# Patient Record
Sex: Male | Born: 1935 | Race: White | Hispanic: No | Marital: Married | Smoking: Former smoker
Health system: Southern US, Community
[De-identification: ages and names within clinical notes are randomized; demographics above are authoritative.]

## PROBLEM LIST (undated history)

## (undated) DIAGNOSIS — E039 Hypothyroidism, unspecified: Secondary | ICD-10-CM

## (undated) DIAGNOSIS — I252 Old myocardial infarction: Secondary | ICD-10-CM

## (undated) DIAGNOSIS — K219 Gastro-esophageal reflux disease without esophagitis: Secondary | ICD-10-CM

## (undated) DIAGNOSIS — K611 Rectal abscess: Secondary | ICD-10-CM

## (undated) DIAGNOSIS — I10 Essential (primary) hypertension: Secondary | ICD-10-CM

## (undated) DIAGNOSIS — F419 Anxiety disorder, unspecified: Secondary | ICD-10-CM

## (undated) DIAGNOSIS — E7800 Pure hypercholesterolemia, unspecified: Secondary | ICD-10-CM

## (undated) HISTORY — PX: CORONARY ARTERY BYPASS GRAFT: SHX141

## (undated) HISTORY — PX: OTHER SURGICAL HISTORY: SHX169

## (undated) HISTORY — DX: Old myocardial infarction: I25.2

## (undated) HISTORY — PX: COLONOSCOPY: SHX174

## (undated) HISTORY — PX: CORONARY ANGIOPLASTY WITH STENT PLACEMENT: SHX49

## (undated) HISTORY — DX: Pure hypercholesterolemia, unspecified: E78.00

---

## 2010-09-03 ENCOUNTER — Ambulatory Visit (HOSPITAL_COMMUNITY): Admission: RE | Admit: 2010-09-03 | Payer: Self-pay | Source: Home / Self Care | Admitting: Internal Medicine

## 2010-09-03 ENCOUNTER — Ambulatory Visit: Payer: Self-pay | Admitting: Internal Medicine

## 2010-10-08 ENCOUNTER — Ambulatory Visit (HOSPITAL_COMMUNITY)
Admission: RE | Admit: 2010-10-08 | Discharge: 2010-10-08 | Payer: Self-pay | Source: Home / Self Care | Attending: Internal Medicine | Admitting: Internal Medicine

## 2010-10-18 NOTE — Op Note (Signed)
NAME:  Anthony Harrell, Anthony Harrell                ACCOUNT NO.:  [id number]  MEDICAL RECORD NO.:  [id number]          PATIENT TYPE:  AMB  LOCATION:  DAY                           FACILITY:  [facility]  PHYSICIAN:  Lionel December, M.D.    DATE OF BIRTH:  03/01/1929  DATE OF PROCEDURE:  10/08/2010 DATE OF DISCHARGE:  10/08/2010                              OPERATIVE REPORT   PROCEDURE:  Colonoscopy with snare polypectomy.  INDICATIONS:  Anthony Harrell is a 77 year old Caucasian male who is undergoing average risk screening colonoscopy.  His last exam was 10 years ago. Procedure risks were reviewed with the patient and informed consent was obtained.  MEDICATIONS FOR CONSCIOUS SEDATION: 1. Demerol 25 mg IV. 2. Versed 3 mg IV.  FINDINGS:  Procedure performed in endoscopy suite.  The patient's vital signs and O2 sats were monitored during the procedure and remained stable.  The patient was placed in left lateral recumbent position and rectal examination performed.  She has perianal deformity secondary to previous surgeries and noncritical anal stenosis.  Pediatric colonoscope was carefully passed across the canal into rectum and thereafter under direct vision into sigmoid colon beyond.  Preparation was satisfactory. Multiple diverticula at sigmoid colon.  Scope was passed into cecum which was identified by appendiceal orifice and ileocecal valve. Pictures were taken for the record.  As the scope was withdrawn, colonic mucosa was carefully examined.  Small polyp was ablated via cold biopsy from the cecum and another one from the transverse colon and there were 2 at the proximal sigmoid colon which were ablated via cold biopsy and submitted in one container.  In the distal sigmoid colon, there were 2 polyps which were located right next to each other, the larger was about 8-9 mm and other one was smaller.  Both of these were snared and submitted in one container.  There was also diffuse pigmentation primarily of the  distal half of the colon consistent with melanosis coli.  The rectal mucosa was normal.  Scope was retroflexed to examine anorectal junction and small hemorrhoids were noted below the dentate line.  Endoscope was straightened and withdrawn.  Withdrawal time was about 20 minutes.  FINAL DIAGNOSES: 1. Examination performed to cecum. 2. Multiple diverticula at sigmoid colon. 3. Mild changes of melanosis coli. 4. Four small polyps were ablated via cold biopsy, one from the cecum,     one from transverse colon and two from the sigmoid colon submitted     in one container. 5. Two polyps were snared from distal sigmoid colon.  The largest one     was 8-9 mm and these were submitted in one container. 6. Anal stenosis secondary to previous surgeries.  Small external     hemorrhoids.  RECOMMENDATIONS:  High-fiber diet.  The patient resume his ASA tomorrow, but we will start his Plavix in 5 days.  I will be contacting the patient with results of biopsy and further recommendations.     Lionel December, M.D.     NR/MEDQ  D:  10/08/2010  T:  10/09/2010  Job:  [id number]  cc:   Dr. Clent Ridges, [place]  Electronically Signed by Lionel December M.D. on 10/18/2010 01:40:53 PM

## 2013-12-07 ENCOUNTER — Ambulatory Visit (INDEPENDENT_AMBULATORY_CARE_PROVIDER_SITE_OTHER): Payer: Medicare Other | Admitting: Internal Medicine

## 2013-12-07 ENCOUNTER — Encounter (INDEPENDENT_AMBULATORY_CARE_PROVIDER_SITE_OTHER): Payer: Self-pay | Admitting: Internal Medicine

## 2013-12-07 ENCOUNTER — Other Ambulatory Visit (INDEPENDENT_AMBULATORY_CARE_PROVIDER_SITE_OTHER): Payer: Self-pay | Admitting: *Deleted

## 2013-12-07 ENCOUNTER — Encounter (INDEPENDENT_AMBULATORY_CARE_PROVIDER_SITE_OTHER): Payer: Self-pay | Admitting: *Deleted

## 2013-12-07 VITALS — BP 128/80 | HR 60 | Temp 98.0°F | Ht 69.0 in | Wt 193.9 lb

## 2013-12-07 DIAGNOSIS — K219 Gastro-esophageal reflux disease without esophagitis: Secondary | ICD-10-CM

## 2013-12-07 DIAGNOSIS — I25810 Atherosclerosis of coronary artery bypass graft(s) without angina pectoris: Secondary | ICD-10-CM | POA: Insufficient documentation

## 2013-12-07 DIAGNOSIS — E7800 Pure hypercholesterolemia, unspecified: Secondary | ICD-10-CM | POA: Insufficient documentation

## 2013-12-07 MED ORDER — ESOMEPRAZOLE MAGNESIUM 40 MG PO CPDR: 40.0000 mg | DELAYED_RELEASE_CAPSULE | Freq: Every day | ORAL | Status: DC

## 2013-12-07 NOTE — Progress Notes (Addendum)
Subjective:     Patient ID: Anthony Harrell, male   DOB: 1929-01-13, 80 y.o.   MRN: [id number]  HPI Presents today with c/o epigastric pain. She has had symptoms x 2 weeks.  He tells me his wife placed him on Nexium and it has helped. Notices the pain in the morning. Not related to food. Pain starts in the epigastric and may radiate under both ribs cases. Appetite is good. He eats anything he wants. No pain in 2 days. No weight loss.  He has had constipation for ages. He takes a stool softener. He has a BM usually daily. He likes his stools loose because of the anal stenosis.   10/08/2010 Colonoscopy with polypectomy, Dr. Laural Golden: FINAL DIAGNOSES:  1. Examination performed to cecum.  2. Multiple diverticula at sigmoid colon.  3. Mild changes of melanosis coli.  4. Four small polyps were ablated via cold biopsy, one from the cecum,  one from transverse colon and two from the sigmoid colon submitted  in one container.  5. Two polyps were snared from distal sigmoid colon. The largest one  was 8-9 mm and these were submitted in one container.  6. Anal stenosis secondary to previous surgeries. Small external  hemorrhoids. Biopsy: Cecal: hyperplastic, Transverse: hyperplastic, Sigmoid: tubular adenoma     Review of Systems see hpi  Past Medical History  Diagnosis Date  . High cholesterol   . MI, old     Past Surgical History  Procedure Laterality Date  . 3 open heart surgeries    . Peri rectal abscess    . Stomach aneurysm surgery  (6cm)    . Coronary angioplasty with stent placement      10 yrs ago.    No Known Allergies  No current outpatient prescriptions on file prior to visit.   No current facility-administered medications on file prior to visit.        Objective:   Physical Exam  Filed Vitals:   12/07/13 1008  BP: 128/80  Pulse: 60  Temp: 98 F (36.7 C)  Height: 5\' 9"  (1.753 m)  Weight: 193 lb 14.4 oz (87.952 kg)   Alert and oriented. Skin warm and dry.  Oral mucosa is moist.   . Sclera anicteric, conjunctivae is pink. Thyroid not enlarged. No cervical lymphadenopathy. Lungs clear. Heart regular rate and rhythm.  Abdomen is soft. Bowel sounds are positive. No hepatomegaly. No abdominal masses felt. No tenderness.  No edema to lower extremities.       Assessment:    GERD, much better since starting wife's Nexium. PUD needs to be ruled out.    Plan:     EGD with Dr. Laural Golden. One month supply of Dexilant given to patient.

## 2013-12-07 NOTE — Patient Instructions (Addendum)
EGD with Dr. Laural Golden.The risks and benefits such as perforation, bleeding, and infection were reviewed with the patient and is agreeable. Separate the Nexium and Plavix.

## 2013-12-11 ENCOUNTER — Encounter (HOSPITAL_COMMUNITY): Payer: Self-pay | Admitting: Pharmacy Technician

## 2013-12-27 ENCOUNTER — Ambulatory Visit (HOSPITAL_COMMUNITY)
Admission: RE | Admit: 2013-12-27 | Discharge: 2013-12-27 | Disposition: A | Discharge status: Home / Self Care | Payer: Medicare Other | Source: Ambulatory Visit | Attending: Internal Medicine | Admitting: Internal Medicine

## 2013-12-27 ENCOUNTER — Encounter (HOSPITAL_COMMUNITY)
Admission: RE | Disposition: A | Discharge status: Home / Self Care | Payer: Self-pay | Source: Ambulatory Visit | Attending: Internal Medicine

## 2013-12-27 ENCOUNTER — Encounter (HOSPITAL_COMMUNITY): Payer: Self-pay | Admitting: *Deleted

## 2013-12-27 DIAGNOSIS — Z79899 Other long term (current) drug therapy: Secondary | ICD-10-CM | POA: Insufficient documentation

## 2013-12-27 DIAGNOSIS — E7800 Pure hypercholesterolemia, unspecified: Secondary | ICD-10-CM | POA: Insufficient documentation

## 2013-12-27 DIAGNOSIS — K208 Other esophagitis without bleeding: Secondary | ICD-10-CM

## 2013-12-27 DIAGNOSIS — K219 Gastro-esophageal reflux disease without esophagitis: Secondary | ICD-10-CM

## 2013-12-27 DIAGNOSIS — K2960 Other gastritis without bleeding: Secondary | ICD-10-CM | POA: Insufficient documentation

## 2013-12-27 DIAGNOSIS — Z7982 Long term (current) use of aspirin: Secondary | ICD-10-CM | POA: Insufficient documentation

## 2013-12-27 DIAGNOSIS — F411 Generalized anxiety disorder: Secondary | ICD-10-CM | POA: Insufficient documentation

## 2013-12-27 DIAGNOSIS — R1013 Epigastric pain: Secondary | ICD-10-CM | POA: Insufficient documentation

## 2013-12-27 DIAGNOSIS — K2100 Gastro-esophageal reflux disease with esophagitis, without bleeding: Secondary | ICD-10-CM | POA: Insufficient documentation

## 2013-12-27 DIAGNOSIS — I10 Essential (primary) hypertension: Secondary | ICD-10-CM | POA: Insufficient documentation

## 2013-12-27 DIAGNOSIS — Z87891 Personal history of nicotine dependence: Secondary | ICD-10-CM | POA: Insufficient documentation

## 2013-12-27 HISTORY — DX: Anxiety disorder, unspecified: F41.9

## 2013-12-27 HISTORY — PX: ESOPHAGOGASTRODUODENOSCOPY: SHX5428

## 2013-12-27 HISTORY — DX: Essential (primary) hypertension: I10

## 2013-12-27 HISTORY — DX: Hypothyroidism, unspecified: E03.9

## 2013-12-27 HISTORY — DX: Gastro-esophageal reflux disease without esophagitis: K21.9

## 2013-12-27 SURGERY — EGD (ESOPHAGOGASTRODUODENOSCOPY)
Anesthesia: Moderate Sedation

## 2013-12-27 MED ORDER — MIDAZOLAM HCL 5 MG/5ML IJ SOLN
INTRAMUSCULAR | Status: AC
  Filled 2013-12-27: qty 10

## 2013-12-27 MED ORDER — SODIUM CHLORIDE 0.9 % IV SOLN
INTRAVENOUS | Status: DC
  Administered 2013-12-27: 12:00:00 via INTRAVENOUS

## 2013-12-27 MED ORDER — DEXLANSOPRAZOLE 60 MG PO CPDR: 60.0000 mg | DELAYED_RELEASE_CAPSULE | Freq: Every day | ORAL | Status: DC

## 2013-12-27 MED ORDER — MIDAZOLAM HCL 5 MG/5ML IJ SOLN
INTRAMUSCULAR | Status: DC | PRN
  Administered 2013-12-27: 1 mg via INTRAVENOUS
  Administered 2013-12-27: 2 mg via INTRAVENOUS
  Administered 2013-12-27: 1 mg via INTRAVENOUS

## 2013-12-27 MED ORDER — BUTAMBEN-TETRACAINE-BENZOCAINE 2-2-14 % EX AERO
INHALATION_SPRAY | CUTANEOUS | Status: DC | PRN
  Administered 2013-12-27: 1 via TOPICAL

## 2013-12-27 MED ORDER — MEPERIDINE HCL 50 MG/ML IJ SOLN
INTRAMUSCULAR | Status: DC | PRN
  Administered 2013-12-27: 25 mg via INTRAVENOUS

## 2013-12-27 MED ORDER — MEPERIDINE HCL 50 MG/ML IJ SOLN
INTRAMUSCULAR | Status: AC
  Filled 2013-12-27: qty 1

## 2013-12-27 MED ORDER — STERILE WATER FOR IRRIGATION IR SOLN
Status: DC | PRN
  Administered 2013-12-27: 13:00:00

## 2013-12-27 NOTE — Discharge Instructions (Addendum)
Resume usual medications and diet. Remember to take Dexilant 60 mg by mouth 30 minutes before breakfast daily and take Plavix every evening. No driving for 24 hours. Physician will call with result of blood test.  Esophagogastroduodenoscopy Care After Refer to this sheet in the next few weeks. These instructions provide you with information on caring for yourself after your procedure. Your caregiver may also give you more specific instructions. Your treatment has been planned according to current medical practices, but problems sometimes occur. Call your caregiver if you have any problems or questions after your procedure.  HOME CARE INSTRUCTIONS  Do not eat or drink anything until the numbing medicine (local anesthetic) has worn off and your gag reflex has returned. You will know that the local anesthetic has worn off when you can swallow comfortably.  Do not drive for 12 hours after the procedure or as directed by your caregiver.  Only take medicines as directed by your caregiver. SEEK MEDICAL CARE IF:   You cannot stop coughing.  You are not urinating at all or less than usual. SEEK IMMEDIATE MEDICAL CARE IF:  You have difficulty swallowing.  You cannot eat or drink.  You have worsening throat or chest pain.  You have dizziness, lightheadedness, or you faint.  You have nausea or vomiting.  You have chills.  You have a fever.  You have severe abdominal pain.  You have black, tarry, or bloody stools. Document Released: 08/17/2012 Document Reviewed: 08/17/2012 [facility] Patient Information 2014 [place], [place].

## 2013-12-27 NOTE — Op Note (Addendum)
EGD PROCEDURE REPORT  PATIENT:  Anthony Harrell  MR#:  [id number] Birthdate:  12/16/1928, 80 y.o., male Endoscopist:  Dr. Rogene Houston, MD Procedure Date: 12/27/2013  Procedure:   EGD  Indications:  Patient is an 80 year old Caucasian male who presents with several week history of epigastric pain not associated with nausea vomiting or melena he also denied anorexia or weight loss. Patient is on Plavix and low-dose aspirin. He is feeling better with PPI. He is undergoing diagnostic EGD.            Informed Consent:  The risks, benefits, alternatives & imponderables which include, but are not limited to, bleeding, infection, perforation, drug reaction and potential missed lesion have been reviewed.  The potential for biopsy, lesion removal, esophageal dilation, etc. have also been discussed.  Questions have been answered.  All parties agreeable.  Please see history & physical in medical record for more information.  Medications:  Demerol 25 mg IV Versed 4 mg IV Cetacaine spray topically for oropharyngeal anesthesia  Description of procedure:  The endoscope was introduced through the mouth and advanced to the second portion of the duodenum without difficulty or limitations. The mucosal surfaces were surveyed very carefully during advancement of the scope and upon withdrawal.  Findings:  Esophagus:  Mucosa of the esophagus was normal. Focal edema and erythema noted at GE junction without erosions ring or stricture. GEJ:  41 cm Stomach:  Stomach was empty and distended very well with insufflation. Folds in the proximal stomach were normal. Scattered antral erosions noted along with scar at angularis best seen on retroflexed view. Pyloric channel was patent. Fundus and cardia were unremarkable. Duodenum:  Normal bulbar and post bulbar mucosa.  Therapeutic/Diagnostic Maneuvers Performed:  None  Complications:  None  Impression: Mild changes of reflux esophagitis limited to GE  junction. Erosive antral gastritis and small scar at angularis  Comment; Patients symptoms could be secondary to peptic ulcer disease which has healed with therapy. Need to rule out H. pylori infection.  Recommendations:  Resume usual medications including clopidogrel or Plavix. H. pylori serology. New prescription for Dexilant sent to patient's pharmacy.  Rogene Houston  12/27/2013  1:07 PM  CC: Dr. Merryl Hacker Not In System & Dr. No ref. provider found

## 2013-12-27 NOTE — H&P (Signed)
Anthony Harrell is an 80 y.o. male.   Chief Complaint: Patient is here for EGD. HPI: Patient is an  80 year old Caucasian male who presents with several week history of epigastric pain not associated with nausea vomiting heartburn melena or rectal bleeding. Pain lately has been radiating to under the right rib cage. Since he he has been taking Dexilant he feels much better. He does not take any NSAIDs but he has been on low-dose aspirin and Plavix.  Past Medical History  Diagnosis Date  . High cholesterol   . MI, old   . Hypothyroidism   . GERD (gastroesophageal reflux disease)   . Hypertension   . Anxiety     Past Surgical History  Procedure Laterality Date  . 3 open heart surgeries    . Peri rectal abscess    . Stomach aneurysm surgery  (6cm)    . Coronary angioplasty with stent placement      10 yrs ago.    History reviewed. No pertinent family history. Social History:  reports that he has quit smoking. His smoking use included Cigarettes. He has a 50 pack-year smoking history. He does not have any smokeless tobacco history on file. He reports that he does not drink alcohol or use illicit drugs.  Allergies: No Known Allergies  Medications Prior to Admission  Medication Sig Dispense Refill  . aspirin 81 MG tablet Take 81 mg by mouth daily.      Marland Kitchen atorvastatin (LIPITOR) 80 MG tablet Take 80 mg by mouth daily.      . Cholecalciferol (VITAMIN D-3) 1000 UNITS CAPS Take 1 capsule by mouth daily.      . clopidogrel (PLAVIX) 75 MG tablet Take 75 mg by mouth daily with breakfast.      . dexlansoprazole (DEXILANT) 60 MG capsule Take 60 mg by mouth daily.      Marland Kitchen ezetimibe (ZETIA) 10 MG tablet Take 10 mg by mouth daily.      Marland Kitchen levothyroxine (SYNTHROID, LEVOTHROID) 150 MCG tablet Take 150 mcg by mouth daily before breakfast.      . LORazepam (ATIVAN) 1 MG tablet Take 1 mg by mouth daily.      . metoprolol (LOPRESSOR) 50 MG tablet Take 50 mg by mouth daily.      . tamsulosin (FLOMAX) 0.4  MG CAPS capsule Take 0.4 mg by mouth daily.        No results found for this or any previous visit (from the past 48 hour(s)). No results found.  ROS  Blood pressure 178/81, pulse 70, temperature 98.1 F (36.7 C), temperature source Oral, height 5\' 9"  (1.753 m), weight 193 lb (87.544 kg), SpO2 94.00%. Physical Exam  Constitutional: He appears well-developed and well-nourished.  HENT:  Mouth/Throat: Oropharynx is clear and moist.  Eyes: Conjunctivae are normal. No scleral icterus.  Neck: No thyromegaly present.  Cardiovascular: Normal rate, regular rhythm and normal heart sounds.   No murmur heard. Respiratory: Effort normal and breath sounds normal.  GI: Soft. He exhibits no distension and no mass. There is no tenderness.  Upper midline scar  Musculoskeletal: He exhibits no edema.  Lymphadenopathy:    He has no cervical adenopathy.  Neurological: He is alert.  Skin: Skin is warm.     Assessment/Plan Epigastric pain. ?PUD. Diagnostic EGD.  Rogene Houston 12/27/2013, 12:44 PM

## 2013-12-28 LAB — H. PYLORI ANTIBODY, IGG: H Pylori IgG: 0.44 {ISR}

## 2014-01-01 ENCOUNTER — Encounter (HOSPITAL_COMMUNITY): Payer: Self-pay | Admitting: Internal Medicine

## 2014-01-01 NOTE — Progress Notes (Signed)
Apt has an apt on 01/29/14 and another 3 month f/u was scheduled for 04/24/14. Wife apt was rescheduled to 04/24/14.

## 2014-01-22 ENCOUNTER — Telehealth (INDEPENDENT_AMBULATORY_CARE_PROVIDER_SITE_OTHER): Payer: Self-pay | Admitting: *Deleted

## 2014-01-22 NOTE — Telephone Encounter (Signed)
A PA was completed over the phone with Anthony Harrell. The patient is approved indefinitely. Confirmation number is [id number]. Patient called and made aware.

## 2014-01-30 ENCOUNTER — Encounter (INDEPENDENT_AMBULATORY_CARE_PROVIDER_SITE_OTHER): Payer: Self-pay | Admitting: Internal Medicine

## 2014-01-30 ENCOUNTER — Ambulatory Visit (INDEPENDENT_AMBULATORY_CARE_PROVIDER_SITE_OTHER): Payer: Medicare Other | Admitting: Internal Medicine

## 2014-01-30 VITALS — BP 118/68 | HR 66 | Temp 97.3°F | Resp 18 | Ht 69.0 in | Wt 195.2 lb

## 2014-01-30 DIAGNOSIS — R1013 Epigastric pain: Secondary | ICD-10-CM

## 2014-01-30 DIAGNOSIS — K219 Gastro-esophageal reflux disease without esophagitis: Secondary | ICD-10-CM

## 2014-01-30 NOTE — Patient Instructions (Signed)
Notify if abdominal pain becomes more frequent.

## 2014-01-30 NOTE — Progress Notes (Signed)
Presenting complaint;  Followup for epigastric pain.  Subjective:  Patient is an 80 year old Caucasian male who was evaluated about 8 weeks ago for epigastric pain and history of melena. He had been taking his wife's Nexium with control of heartburn but not epigastric pain. He had been on low-dose aspirin and Plavix. He was begun on Dexilant. He underwent EGD on 12/27/2013 revealing mild changes of reflux esophagitis at GE junction erosive antral gastritis and sclerotic angularis. H. pylori serology was negative. He has been maintained on Dexilant. He states he is feeling much better. He is not taking PPI on a daily basis. He has had very little epigastric pain and now has migrated to the right of midline. This pain is not associated with nausea vomiting and he has not experienced any more episodes of melena. He states he got up one morning and felt very weak and could not support and fell and hit his bed. He was seen in urgent care and also at [facility] clinic the same day. He has followup appointment. He has not had any more spells. He says his appetite is very good. His weight is stable. He gives history of long-standing constipation and presently he is taking [employer] brand laxative which works well for him.        Current Medications: Outpatient Encounter Prescriptions as of 01/30/2014  Medication Sig  . aspirin 81 MG tablet Take 81 mg by mouth daily.  Marland Kitchen atorvastatin (LIPITOR) 80 MG tablet Take 40 mg by mouth daily.   . Cholecalciferol (VITAMIN D-3) 1000 UNITS CAPS Take 1 capsule by mouth daily.  . clopidogrel (PLAVIX) 75 MG tablet Take 75 mg by mouth daily with breakfast.  . dexlansoprazole (DEXILANT) 60 MG capsule Take 60 mg by mouth daily.  Marland Kitchen ezetimibe (ZETIA) 10 MG tablet Take 10 mg by mouth daily.  Marland Kitchen levothyroxine (SYNTHROID, LEVOTHROID) 150 MCG tablet Take 150 mcg by mouth daily before breakfast.  . LORazepam (ATIVAN) 1 MG tablet Take 1 mg by mouth daily.  . metoprolol (LOPRESSOR) 50 MG  tablet Take 50 mg by mouth daily.  . tamsulosin (FLOMAX) 0.4 MG CAPS capsule Take 0.4 mg by mouth as needed.      Objective: Blood pressure 118/68, pulse 66, temperature 97.3 F (36.3 C), temperature source Oral, resp. rate 18, height 5\' 9"  (1.753 m), weight 195 lb 3.2 oz (88.542 kg). Patient is alert and in no acute distress. Conjunctiva is pink. Sclera is nonicteric Oropharyngeal mucosa is normal. No neck masses or thyromegaly noted. Cardiac exam with regular rhythm normal S1 and S2. No murmur or gallop noted. Lungs are clear to auscultation. Abdomen symmetrical and soft without tenderness organomegaly or masses.  No LE edema or clubbing noted.     Assessment:  #1. Epigastric pain has improved significantly since he has been on PPI. It is not infrequent although it has migrated to the right. He does not have any other symptoms to suggest gallbladder disease but we will have to keep an eye on him. #2. GERD. Heartburn well-controlled with therapy.   Plan:  Patient advised to take Dexilant every morning. If pain recurs or if he has nausea and vomiting will consider upper abdominal ultrasound. Office visit in 6 months.

## 2014-04-24 ENCOUNTER — Ambulatory Visit (INDEPENDENT_AMBULATORY_CARE_PROVIDER_SITE_OTHER): Payer: Medicare Other | Admitting: Internal Medicine

## 2014-06-14 ENCOUNTER — Encounter (INDEPENDENT_AMBULATORY_CARE_PROVIDER_SITE_OTHER): Payer: Self-pay | Admitting: *Deleted

## 2014-08-06 ENCOUNTER — Ambulatory Visit (INDEPENDENT_AMBULATORY_CARE_PROVIDER_SITE_OTHER): Payer: Medicare Other | Admitting: Internal Medicine

## 2014-08-21 ENCOUNTER — Encounter (INDEPENDENT_AMBULATORY_CARE_PROVIDER_SITE_OTHER): Payer: Self-pay | Admitting: Internal Medicine

## 2014-08-21 ENCOUNTER — Other Ambulatory Visit (INDEPENDENT_AMBULATORY_CARE_PROVIDER_SITE_OTHER): Payer: Self-pay | Admitting: Internal Medicine

## 2014-08-21 ENCOUNTER — Ambulatory Visit (HOSPITAL_COMMUNITY)
Admission: RE | Admit: 2014-08-21 | Discharge: 2014-08-21 | Disposition: A | Discharge status: Home / Self Care | Payer: Medicare Other | Source: Ambulatory Visit | Attending: Internal Medicine | Admitting: Internal Medicine

## 2014-08-21 ENCOUNTER — Telehealth (INDEPENDENT_AMBULATORY_CARE_PROVIDER_SITE_OTHER): Payer: Self-pay | Admitting: *Deleted

## 2014-08-21 ENCOUNTER — Ambulatory Visit (INDEPENDENT_AMBULATORY_CARE_PROVIDER_SITE_OTHER): Payer: Medicare Other | Admitting: Internal Medicine

## 2014-08-21 VITALS — BP 120/66 | HR 68 | Temp 97.6°F | Resp 16 | Ht 69.0 in | Wt 186.0 lb

## 2014-08-21 DIAGNOSIS — R1030 Lower abdominal pain, unspecified: Secondary | ICD-10-CM

## 2014-08-21 DIAGNOSIS — K5730 Diverticulosis of large intestine without perforation or abscess without bleeding: Secondary | ICD-10-CM | POA: Insufficient documentation

## 2014-08-21 DIAGNOSIS — K838 Other specified diseases of biliary tract: Secondary | ICD-10-CM | POA: Insufficient documentation

## 2014-08-21 DIAGNOSIS — I719 Aortic aneurysm of unspecified site, without rupture: Secondary | ICD-10-CM | POA: Diagnosis not present

## 2014-08-21 DIAGNOSIS — R630 Anorexia: Secondary | ICD-10-CM

## 2014-08-21 DIAGNOSIS — M4856XA Collapsed vertebra, not elsewhere classified, lumbar region, initial encounter for fracture: Secondary | ICD-10-CM | POA: Diagnosis not present

## 2014-08-21 DIAGNOSIS — R109 Unspecified abdominal pain: Secondary | ICD-10-CM | POA: Diagnosis present

## 2014-08-21 DIAGNOSIS — R110 Nausea: Secondary | ICD-10-CM

## 2014-08-21 LAB — POCT I-STAT CREATININE: CREATININE: 1.5 mg/dL — AB (ref 0.50–1.35)

## 2014-08-21 LAB — CBC
HEMATOCRIT: 41.9 % (ref 39.0–52.0)
Hemoglobin: 13.9 g/dL (ref 13.0–17.0)
MCH: 29.2 pg (ref 26.0–34.0)
MCHC: 33.2 g/dL (ref 30.0–36.0)
MCV: 88 fL (ref 78.0–100.0)
MPV: 9.9 fL (ref 9.4–12.4)
Platelets: 22 10*3/uL — CL (ref 150–400)
RBC: 4.76 MIL/uL (ref 4.22–5.81)
RDW: 13.9 % (ref 11.5–15.5)
WBC: 6.5 10*3/uL (ref 4.0–10.5)

## 2014-08-21 LAB — COMPREHENSIVE METABOLIC PANEL
ALBUMIN: 3.6 g/dL (ref 3.5–5.2)
ALT: 13 U/L (ref 0–53)
AST: 25 U/L (ref 0–37)
Alkaline Phosphatase: 121 U/L — ABNORMAL HIGH (ref 39–117)
BUN: 22 mg/dL (ref 6–23)
CALCIUM: 9.5 mg/dL (ref 8.4–10.5)
CHLORIDE: 100 meq/L (ref 96–112)
CO2: 24 meq/L (ref 19–32)
CREATININE: 1.35 mg/dL (ref 0.50–1.35)
GLUCOSE: 84 mg/dL (ref 70–99)
POTASSIUM: 5.4 meq/L — AB (ref 3.5–5.3)
Sodium: 134 mEq/L — ABNORMAL LOW (ref 135–145)
Total Bilirubin: 1.3 mg/dL — ABNORMAL HIGH (ref 0.3–1.2)
Total Protein: 6.8 g/dL (ref 6.0–8.3)

## 2014-08-21 MED ORDER — ONDANSETRON 4 MG PO TBDP: 4.0000 mg | ORAL_TABLET | Freq: Three times a day (TID) | ORAL | Status: DC | PRN

## 2014-08-21 MED ORDER — METRONIDAZOLE 500 MG PO TABS: 500.0000 mg | ORAL_TABLET | Freq: Two times a day (BID) | ORAL | Status: DC

## 2014-08-21 MED ORDER — IOHEXOL 300 MG/ML  SOLN
100.0000 mL | Freq: Once | INTRAMUSCULAR | Status: AC | PRN
  Administered 2014-08-21: 100 mL via INTRAVENOUS

## 2014-08-21 MED ORDER — CIPROFLOXACIN HCL 500 MG PO TABS: 500.0000 mg | ORAL_TABLET | Freq: Two times a day (BID) | ORAL | Status: DC

## 2014-08-21 NOTE — Telephone Encounter (Signed)
Per Dr.Rehman the patient will need to have labs drawn STAT

## 2014-08-21 NOTE — Patient Instructions (Signed)
Physician will call with results of blood work and CT when completed 

## 2014-08-21 NOTE — Progress Notes (Signed)
Presenting complaint;  Lower abdominal pain anorexia and nausea.  Subjective:  Anthony Harrell is 80 year old Caucasian male who was doing well until he fell off a ladder about 3 weeks ago. He fell from a height of 10-12 feet and hit 2 chairs on his right side and back. He was taken to emergency room. He also had scalp hematoma. He had MR of brain was negative. He also had plain abdominal and pelvic films and no abnormality noted. He was given hydrocodone which she's been taking an as-needed basis. He did not feel any better and within a couple of days he went to urgent care where EKG was noted to be abnormal and he was advised to see his cardiologist. He was seen by Dr. Virgilio Belling PA; repeat EKG did not show any new abnormalities. He was advised to see orthopedic surgeon told him to see a surgeon. Patient decided to call her office rather than see a Psychologist, sport and exercise. He is accompanied by his wife. States he feels miserable. He has no appetite. He's been nauseated but has not vomited. His weight is down by 9 pounds since his last visit of May 2015. He believes he's lost all of dysphagia and the last 3 weeks. He has mild pain across lower abdomen during the daytime but pain becomes quite intense after evening meals. In radiates into left testicle. He denies hematuria dysuria rectal bleeding or melena. His stools have been loose but usually 1 every other day. He has not experienced fever or chills. His wife does not remember seeing any bruise over his back lower abdomen and he fell.  Current Medications: Outpatient Encounter Prescriptions as of 08/21/2014  Medication Sig  . aspirin 81 MG tablet Take 81 mg by mouth daily.  Marland Kitchen atorvastatin (LIPITOR) 80 MG tablet Take 40 mg by mouth daily.   . Cholecalciferol (VITAMIN D-3) 1000 UNITS CAPS Take 1 capsule by mouth daily.  . clopidogrel (PLAVIX) 75 MG tablet Take 75 mg by mouth daily with breakfast.  . ezetimibe (ZETIA) 10 MG tablet Take 10 mg by mouth daily.  Marland Kitchen FLUZONE  QUADRIVALENT 0.5 ML injection Inject 0.5 mLs into the muscle once.   Marland Kitchen HYDROcodone-acetaminophen (NORCO/VICODIN) 5-325 MG per tablet Take by mouth as needed (Patient states that he takes 1/2 tablet as needed). for pain  . levothyroxine (SYNTHROID, LEVOTHROID) 150 MCG tablet Take 150 mcg by mouth daily before breakfast.  . lisinopril (PRINIVIL,ZESTRIL) 10 MG tablet Take 10 mg by mouth daily.  Marland Kitchen LORazepam (ATIVAN) 1 MG tablet Take 1 mg by mouth daily.  . metoprolol (LOPRESSOR) 50 MG tablet Take 50 mg by mouth daily.  . metoprolol succinate (TOPROL-XL) 50 MG 24 hr tablet Take 50 mg by mouth daily.  Marland Kitchen omega-3 acid ethyl esters (LOVAZA) 1 G capsule Take 1 g by mouth daily.  . tamsulosin (FLOMAX) 0.4 MG CAPS capsule Take 0.4 mg by mouth as needed.   . [DISCONTINUED] dexlansoprazole (DEXILANT) 60 MG capsule Take 60 mg by mouth daily.     Objective: Blood pressure 120/66, pulse 68, temperature 97.6 F (36.4 C), temperature source Oral, resp. rate 16, height 5\' 9"  (1.753 m), weight 186 lb (84.369 kg). Patient is alert and in no acute distress. Conjunctiva is pink. Sclera is nonicteric Oropharyngeal mucosa is normal. No neck masses or thyromegaly noted. Cardiac exam with regular rhythm normal S1 and S2. No murmur or gallop noted. Lungs are clear to auscultation. Abdomen. Bowel sounds are normal. Abdomen is soft with mild tenderness at LLQ and hypogastric region.  No organomegaly or masses. No hernia noted. External genitalia within normal limits. Mild tenderness noted at renal angle. No tenderness noted on prior percussion of thoracic or lumbar spine. No LE edema or clubbing noted.    Assessment:  #1. Lower abdominal pain associated with nausea and anorexia since fall 3 weeks ago. Evaluation in emergency room post fall and also in urgent care was unremarkable. He does not have peritoneal signs. Given that he is on low-dose aspirin and Plavix he could easily bled in his abdomen or  retroperitoneal area.   Plan:  CBC and comprehensive chemistry panel. Ondansetron 4 mg by mouth 3 times a day when necessary. We will continue to use hydrocodone/acetaminophen one as-needed basis. He already has a prescription. Abdominopelvic CT with contrast today. Further recommendations to follow.

## 2014-08-22 ENCOUNTER — Telehealth (INDEPENDENT_AMBULATORY_CARE_PROVIDER_SITE_OTHER): Payer: Self-pay | Admitting: *Deleted

## 2014-08-22 DIAGNOSIS — R1030 Lower abdominal pain, unspecified: Secondary | ICD-10-CM

## 2014-08-22 LAB — PLATELET COUNT: Platelets: 155 10*3/uL (ref 150–400)

## 2014-08-22 NOTE — Telephone Encounter (Signed)
   Ref Range 10:36 AM    Platelets 150 - 400 K/uL 155   Resulting Agency [facility]    Dr.Rehman was made aware of this result.Marland Kitchen He ask that the patient see him in 2 weeks.at this time another CBC/D will be drawn. Patient may resume his aspirin and plavix. Let the patient know that this is great result. Patient made aware.

## 2014-08-22 NOTE — Telephone Encounter (Signed)
Per Dr.Rehman the patient will need to have labs drawn STAT, patient is to wait in our office until Dr.Rehman reviews the result and makes a recommendation.

## 2014-08-23 NOTE — Telephone Encounter (Signed)
Apt has been scheduled for 09/03/14 with Dr. Laural Golden.

## 2014-08-23 NOTE — Telephone Encounter (Signed)
Apt has been scheduled for 09/03/14 with Dr. Laural Golden. Advised to finish the medications he received also. Voices understood.

## 2014-08-24 ENCOUNTER — Telehealth (INDEPENDENT_AMBULATORY_CARE_PROVIDER_SITE_OTHER): Payer: Self-pay | Admitting: *Deleted

## 2014-08-24 DIAGNOSIS — R1032 Left lower quadrant pain: Secondary | ICD-10-CM

## 2014-08-24 NOTE — Telephone Encounter (Signed)
Per Dr.Rehman the patient will need to have labs drawn at the time of his office visit 09/03/14.

## 2014-08-27 ENCOUNTER — Telehealth (INDEPENDENT_AMBULATORY_CARE_PROVIDER_SITE_OTHER): Payer: Self-pay | Admitting: *Deleted

## 2014-08-27 NOTE — Telephone Encounter (Signed)
I have attempted three times to reach patient by phone. Received a message that something had not been set up for this number called, disconnected.

## 2014-08-27 NOTE — Telephone Encounter (Signed)
Ray called wanting to see Dr. Laural Golden. Advised him that Dr. Laural Golden was out of the office and he said he will try to make it until next week. All Ray would say was he was hurting. His return phone number is [phone].

## 2014-08-30 NOTE — Telephone Encounter (Signed)
I been able to reach patient. He states that he is having a terrible time. The pain is tremendous. It is from his belt buckle down to his testicles. Since accident he has had no to little bowel movement but says that he's not been eating that much either. He has ben using laxatives and a few suppositories. Prior to seeing Dr.Rehman , he mentions that he saw a bone doctor who wanted him to see a surgeon, he ask if there was anything seen in the recent scan that may be the reason for his pain?  Patient was advised that Dr.Rehman would be made aware , and I would call him back with his recommendation.

## 2014-08-30 NOTE — Telephone Encounter (Signed)
Dr.Rehman was made aware of the patient 's symptoms. He suggest that the patient,if the is pain is tremendous, go to the ED for an evaluation. He also ask that the patient Not take stimulant laxatives, he ask that he take the Miralax 17 grams daily as needed for constipation. ED physician may contact [facility] for further info.  Patient called and made aware. He states that he is going to come to the ED/[facility] He mentions that he has completed both the Cipro /Flagyl and may need another refill. Ask patient to be evaluated first and if this is the recommendation,we will take care of it for him.

## 2014-09-03 ENCOUNTER — Ambulatory Visit (INDEPENDENT_AMBULATORY_CARE_PROVIDER_SITE_OTHER): Payer: Medicare Other | Admitting: Internal Medicine

## 2014-09-03 ENCOUNTER — Encounter (INDEPENDENT_AMBULATORY_CARE_PROVIDER_SITE_OTHER): Payer: Self-pay | Admitting: Internal Medicine

## 2014-09-03 VITALS — BP 110/70 | HR 80 | Temp 97.0°F | Resp 20 | Ht 69.0 in | Wt 185.7 lb

## 2014-09-03 DIAGNOSIS — K5732 Diverticulitis of large intestine without perforation or abscess without bleeding: Secondary | ICD-10-CM

## 2014-09-03 DIAGNOSIS — K5901 Slow transit constipation: Secondary | ICD-10-CM

## 2014-09-03 DIAGNOSIS — R1030 Lower abdominal pain, unspecified: Secondary | ICD-10-CM

## 2014-09-03 MED ORDER — HYDROCODONE-ACETAMINOPHEN 5-325 MG PO TABS: 0.5000 | ORAL_TABLET | Freq: Four times a day (QID) | ORAL | Status: DC | PRN

## 2014-09-03 MED ORDER — BENEFIBER DRINK MIX PO PACK: 4.0000 g | PACK | Freq: Every day | ORAL | Status: DC

## 2014-09-03 MED ORDER — DOCUSATE SODIUM 100 MG PO CAPS: 200.0000 mg | ORAL_CAPSULE | Freq: Every day | ORAL | Status: AC

## 2014-09-03 NOTE — Patient Instructions (Signed)
Do not take OTC laxatives other than polyethylene glycol or MiraLAX on as-needed basis. Remember to take Colace(stool softener without a laxative) and Benefiber every day.

## 2014-09-03 NOTE — Progress Notes (Signed)
Presenting complaint;  Follow-up for lower abdominal pain.  Database;  Patient is 80 year old Caucasian male who is here for reevaluation of lower abdominal pain. He was last seen on 08/21/2014. His symptoms began 3 weeks prior to that visit after he fell off the latter while cleaning gutters. He had mild tenderness across lower abdomen. Abdominal pelvic CT was obtained which revealed colonic diverticulosis and suggested changes of diverticulitis. He did not have hemoperitoneum or adenopathy. He was begun on Cipro and metronidazole. Lab studies were pertinent for platelet count of 22,000. He was advised to discontinue aspirin and Plavix. Platelet count was repeated the following morning and was 155K. Patient was advised to resume aspirin and Plavix as before.  Subjective;  Patient reports significant symptomatic improvement in the last 3 days. He now has mild discomfort or soreness. He experienced nausea while he was in antibiotic therapy but it has not resolved. He is having less testicular pain. He has been constipated. He did take laxative last week. He was advised not to take stimulant laxatives and use polyethylene glycol instead. He is only lost 1 pound since his last visit. He does complain of intermittent postural lightheadedness. He denies back pain hematuria dysuria melena or rectal bleeding.   Current Medications: Outpatient Encounter Prescriptions as of 09/03/2014  Medication Sig  . aspirin 81 MG tablet Take 81 mg by mouth daily.  Marland Kitchen atorvastatin (LIPITOR) 80 MG tablet Take 40 mg by mouth daily.   . Cholecalciferol (VITAMIN D-3) 1000 UNITS CAPS Take 1 capsule by mouth daily.  . clopidogrel (PLAVIX) 75 MG tablet Take 75 mg by mouth daily with breakfast.  . ezetimibe (ZETIA) 10 MG tablet Take 10 mg by mouth daily.  Marland Kitchen HYDROcodone-acetaminophen (NORCO/VICODIN) 5-325 MG per tablet Take by mouth as needed (Patient states that he takes 1/2 tablet as needed). for pain  . levothyroxine  (SYNTHROID, LEVOTHROID) 150 MCG tablet Take 150 mcg by mouth daily before breakfast.  . lisinopril (PRINIVIL,ZESTRIL) 10 MG tablet Take 10 mg by mouth daily.  Marland Kitchen LORazepam (ATIVAN) 1 MG tablet Take 1 mg by mouth daily.  . metoprolol (LOPRESSOR) 50 MG tablet Take 50 mg by mouth daily.  Marland Kitchen omega-3 acid ethyl esters (LOVAZA) 1 G capsule Take 1 g by mouth daily.  . ondansetron (ZOFRAN-ODT) 4 MG disintegrating tablet Take 1 tablet (4 mg total) by mouth every 8 (eight) hours as needed for nausea or vomiting.  . tamsulosin (FLOMAX) 0.4 MG CAPS capsule Take 0.4 mg by mouth as needed.   . ciprofloxacin (CIPRO) 500 MG tablet Take 1 tablet (500 mg total) by mouth 2 (two) times daily. (Patient not taking: Reported on 09/03/2014)  . FLUZONE QUADRIVALENT 0.5 ML injection Inject 0.5 mLs into the muscle once.   . metoprolol succinate (TOPROL-XL) 50 MG 24 hr tablet Take 50 mg by mouth daily.  . metroNIDAZOLE (FLAGYL) 500 MG tablet Take 1 tablet (500 mg total) by mouth 2 (two) times daily. (Patient not taking: Reported on 09/03/2014)     Objective: Blood pressure 110/70, pulse 80, temperature 97 F (36.1 C), temperature source Oral, resp. rate 20, height 5\' 9"  (1.753 m), weight 185 lb 11.2 oz (84.233 kg). Patient is alert and in no acute distress. Conjunctiva is pink. Sclera is nonicteric Oropharyngeal mucosa is normal. No neck masses or thyromegaly noted. Cardiac exam with regular rhythm normal S1 and S2. No murmur or gallop noted. Lungs are clear to auscultation. Abdomen is symmetrical. Bowel sounds are normal. Abdomen is soft with mild tenderness  and hypogastric region. No organomegaly masses or inguinal hernia noted.  No LE edema or clubbing noted.  Labs/studies Results: Lab studies from 08/21/2014 as well as 08/22/2014 reviewed.   abdominopelvic CT films were reviewed with the patient. This study revealed superior endplate compression fracture of L1 felt to be chronic. Extensive sigmoid diverticulosis  without clear cut evidence of acute diverticulitis. Mild dilation of CBD and and pancreatic duct. 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 Bilateral renal cysts.  Assessment:  #1. Lower abdominal pain possibly secondary to sigmoid diverticulitis. He has responded to antibiotic therapy which he has completed. #2. Abdominopelvic CT pertinent for old fracture to superior endplate of L1. No prior CT for comparison. He does not have symptoms to suggest acute fracture. #3. Constipation.  Plan:  High-fiber diet. Benefiber 4 g by mouth daily at bedtime. Colace 200 mg by mouth daily. Use polyethylene glycol on as-needed basis. Do not she was stimulant laxative such as sennakot. New prescription given for hydrocodone/acetaminophen 5-325 half a tablet every 6 when necessary. Patient advised to make an appointment to see PCP at [facility] clinic regarding post dural symptoms. He may need adjustment in antihypertensives. Office visit in 2 months.

## 2014-09-21 ENCOUNTER — Encounter (INDEPENDENT_AMBULATORY_CARE_PROVIDER_SITE_OTHER): Payer: Self-pay

## 2014-10-01 ENCOUNTER — Ambulatory Visit (INDEPENDENT_AMBULATORY_CARE_PROVIDER_SITE_OTHER): Payer: Medicare Other | Admitting: Internal Medicine

## 2014-10-01 ENCOUNTER — Encounter (INDEPENDENT_AMBULATORY_CARE_PROVIDER_SITE_OTHER): Payer: Self-pay | Admitting: *Deleted

## 2014-10-01 ENCOUNTER — Encounter (INDEPENDENT_AMBULATORY_CARE_PROVIDER_SITE_OTHER): Payer: Self-pay | Admitting: Internal Medicine

## 2014-10-01 VITALS — BP 110/70 | HR 82 | Temp 97.5°F | Resp 18 | Ht 69.0 in | Wt 178.9 lb

## 2014-10-01 DIAGNOSIS — R110 Nausea: Secondary | ICD-10-CM

## 2014-10-01 DIAGNOSIS — R634 Abnormal weight loss: Secondary | ICD-10-CM

## 2014-10-01 MED ORDER — ONDANSETRON HCL 4 MG PO TABS: 4.0000 mg | ORAL_TABLET | Freq: Two times a day (BID) | ORAL | Status: DC | PRN

## 2014-10-01 NOTE — Progress Notes (Signed)
Presenting complaint;  Nausea anorexia and weight loss.  Subjective:  Anthony Harrell is an 81 year old Caucasian male who is here for scheduled visit. He was seen twice in December 2015. He was experiencing lower abdominal pain after fall while he was cleaning gutters. Initially he was seen on 08/21/2014 and CT revealed changes of diverticulitis. He was treated with antibiotics and return for follow-up visit on 09/03/2014 and was feeling much better. However he was admitted to [facility] on Christmas day for nausea and vomiting and CT revealed residual changes of diverticulitis and he was retreated. He returns with complaints of worsening nausea. He says when he sits down to eat his appetite goes away. He has not had emesis since Christmas Day. He denies heartburn and dysphagia abdominal pain melena or rectal bleeding. He has lost 8 pounds since 08/21/2014 visit. Lately he has noted frequent burping but heartburn does not occur very often. He does not take NSAIDs. He does not feel depressed.  Last EGD was in April 2015 when he was complaining of epigastric pain. He had mild changes of reflux esophagitis erosive antral gastritis and scar at angularis. H pylori serology was negative.   Current Medications: Outpatient Encounter Prescriptions as of 10/01/2014  Medication Sig  . aspirin 81 MG tablet Take 81 mg by mouth daily.  Marland Kitchen atorvastatin (LIPITOR) 80 MG tablet Take 40 mg by mouth daily.   . Cholecalciferol (VITAMIN D-3) 1000 UNITS CAPS Take 1 capsule by mouth daily.  . clopidogrel (PLAVIX) 75 MG tablet Take 75 mg by mouth daily with breakfast.  . docusate sodium (COLACE) 100 MG capsule Take 2 capsules (200 mg total) by mouth daily.  Marland Kitchen ezetimibe (ZETIA) 10 MG tablet Take 10 mg by mouth daily.  Marland Kitchen FLUZONE QUADRIVALENT 0.5 ML injection Inject 0.5 mLs into the muscle once.   Marland Kitchen HYDROcodone-acetaminophen (NORCO/VICODIN) 5-325 MG per tablet Take 0.5 tablets by mouth every 6 (six) hours as needed (Patient states that  he takes 1/2 tablet as needed). for pain  . levothyroxine (SYNTHROID, LEVOTHROID) 150 MCG tablet Take 150 mcg by mouth daily before breakfast.  . lisinopril (PRINIVIL,ZESTRIL) 10 MG tablet Take 10 mg by mouth daily.  Marland Kitchen LORazepam (ATIVAN) 1 MG tablet Take 1 mg by mouth daily.  . metoprolol succinate (TOPROL-XL) 50 MG 24 hr tablet Take 50 mg by mouth daily.  Marland Kitchen omega-3 acid ethyl esters (LOVAZA) 1 G capsule Take 1 g by mouth 2 (two) times daily.   . tamsulosin (FLOMAX) 0.4 MG CAPS capsule Take 0.4 mg by mouth as needed.   . Wheat Dextrin (BENEFIBER DRINK MIX) PACK Take 4 g by mouth at bedtime. (Patient not taking: Reported on 10/01/2014)  . [DISCONTINUED] ondansetron (ZOFRAN-ODT) 4 MG disintegrating tablet Take 1 tablet (4 mg total) by mouth every 8 (eight) hours as needed for nausea or vomiting. (Patient not taking: Reported on 10/01/2014)     Objective: Blood pressure 110/70, pulse 82, temperature 97.5 F (36.4 C), temperature source Oral, resp. rate 18, height 5\' 9"  (1.753 m), weight 178 lb 14.4 oz (81.149 kg). Patient is alert and in no acute distress. Conjunctiva is pink. Sclera is nonicteric Oropharyngeal mucosa is normal. No neck masses or thyromegaly noted. Cardiac exam with regular rhythm normal S1 and S2. No murmur or gallop noted. Lungs are clear to auscultation. Abdomen is symmetrical. No bruit noted. On palpation abdomen is soft and nontender without organomegaly or masses. No LE edema or clubbing noted.  Labs/studies Results:   he had CBC and comprehensive chemistry  panel on 08/21/2014. CBC was normal except platelet count was 22,000 but it was normal and the first one was a lab error. Comprehensive chemistry panel was pertinent for serum sodium 134 bilirubin 1.3 AP of 121 and creatinine was 1.35 Serum calcium was 9.5. Lab data from [facility] reviewed; H&H was 12.2 and 37 on 09/08/2014. H&H was 10.9 and 33.1 on 09/09/2014.  Assessment:  #1. Nausea and anorexia associated with 8  pound weight loss over the last 6 weeks or so. He has been treated twice for diverticulitis and his abdominal exam is unremarkable. He has had 2 abdominal CTs since 08/21/2014. He has history of peptic ulcer disease but none was found on EGD of April 2015. Need to rule out cholelithiasis or peptic ulcer disease.. Symptoms not typical of gastroparesis. #2. Mild anemia encountered on recent hospitalization possibly due to acute illness. Will repeat CBC at some point.   Plan:  Ondansetron 4 mg by mouth twice a day when necessary. Upper abdominal ultrasound.

## 2014-10-01 NOTE — Patient Instructions (Addendum)
Physician will call with results of ultrasound and completed.  Weight check in 4 weeks

## 2014-10-04 ENCOUNTER — Ambulatory Visit (HOSPITAL_COMMUNITY): Admission: RE | Admit: 2014-10-04 | Payer: Medicare Other | Source: Ambulatory Visit

## 2014-10-04 ENCOUNTER — Ambulatory Visit (HOSPITAL_COMMUNITY)
Admission: RE | Admit: 2014-10-04 | Discharge: 2014-10-04 | Disposition: A | Discharge status: Home / Self Care | Payer: Medicare Other | Source: Ambulatory Visit | Attending: Internal Medicine | Admitting: Internal Medicine

## 2014-10-04 DIAGNOSIS — I700 Atherosclerosis of aorta: Secondary | ICD-10-CM | POA: Insufficient documentation

## 2014-10-04 DIAGNOSIS — R634 Abnormal weight loss: Secondary | ICD-10-CM | POA: Insufficient documentation

## 2014-10-04 DIAGNOSIS — N281 Cyst of kidney, acquired: Secondary | ICD-10-CM | POA: Diagnosis not present

## 2014-10-04 DIAGNOSIS — R110 Nausea: Secondary | ICD-10-CM | POA: Diagnosis present

## 2014-10-08 ENCOUNTER — Other Ambulatory Visit (INDEPENDENT_AMBULATORY_CARE_PROVIDER_SITE_OTHER): Payer: Self-pay | Admitting: Internal Medicine

## 2014-10-08 DIAGNOSIS — R935 Abnormal findings on diagnostic imaging of other abdominal regions, including retroperitoneum: Secondary | ICD-10-CM

## 2014-10-10 ENCOUNTER — Other Ambulatory Visit (INDEPENDENT_AMBULATORY_CARE_PROVIDER_SITE_OTHER): Payer: Self-pay | Admitting: Internal Medicine

## 2014-10-10 ENCOUNTER — Telehealth (INDEPENDENT_AMBULATORY_CARE_PROVIDER_SITE_OTHER): Payer: Self-pay | Admitting: *Deleted

## 2014-10-10 MED ORDER — PANTOPRAZOLE SODIUM 40 MG PO TBEC: 40.0000 mg | DELAYED_RELEASE_TABLET | Freq: Every day | ORAL | Status: AC

## 2014-10-10 NOTE — Telephone Encounter (Signed)
Anthony Harrell called stating he is very sick from nausea and running to the bathroom more frequently. Would like to see if Dr. Laural Golden would please return his call at [phone].

## 2014-10-10 NOTE — Telephone Encounter (Signed)
Patient called.  He would like the result of his Gallbladder test done earlier this month. Says that he talked with Dr.Rehman last week and Dr.Rehman told him he would call him back. Not feeling well at all. Nausea and Vomiting , diarrhea every 2-3 hours (today) States that he does not need any medications,however he may need a OV pretty quick with Dr.Rehman.

## 2014-10-10 NOTE — Telephone Encounter (Signed)
[place] results reviewed with patient. No evidence of cholelithiasis. No cysts are of no concern. Cyst in the right kidney is small calcified area or stone. Patient is not taking PPI. Not sure when he stopped this medication Prescription for pantoprazole 40 mg by mouth every morning sent to his pharmacy. Patient does not feel better within a week will consider EGD

## 2014-10-12 ENCOUNTER — Ambulatory Visit (HOSPITAL_COMMUNITY): Payer: Medicare Other

## 2014-10-30 ENCOUNTER — Ambulatory Visit (INDEPENDENT_AMBULATORY_CARE_PROVIDER_SITE_OTHER): Payer: Medicare Other | Admitting: Internal Medicine

## 2014-10-30 ENCOUNTER — Encounter (INDEPENDENT_AMBULATORY_CARE_PROVIDER_SITE_OTHER): Payer: Self-pay | Admitting: Internal Medicine

## 2014-10-30 VITALS — BP 130/70 | HR 74 | Temp 97.6°F | Resp 18 | Ht 69.0 in | Wt 178.5 lb

## 2014-10-30 DIAGNOSIS — R110 Nausea: Secondary | ICD-10-CM

## 2014-10-30 DIAGNOSIS — R634 Abnormal weight loss: Secondary | ICD-10-CM | POA: Insufficient documentation

## 2014-10-30 DIAGNOSIS — K219 Gastro-esophageal reflux disease without esophagitis: Secondary | ICD-10-CM

## 2014-10-30 NOTE — Progress Notes (Signed)
Subjective:  Patient is 81 year old Caucasian male who presents for scheduled visit. He was last seen 4 weeks ago. He was begun on pantoprazole. He has a history of GERD and peptic ulcer disease. He feels much better. He has had few episodes of nausea relieved with ondansetron. He does not even remember the last time he had vomiting. His appetite is improved but not normal. His bowels are moving daily as long as he stays on stool softener and Benefiber. He denies melena or rectal bleeding. He is concerned about his memory loss and is seeing a physician at [facility] in [place].   Current Medications: Outpatient Encounter Prescriptions as of 10/30/2014  Medication Sig  . aspirin 81 MG tablet Take 81 mg by mouth daily.  Marland Kitchen atorvastatin (LIPITOR) 80 MG tablet Take 40 mg by mouth daily.   . Cholecalciferol (VITAMIN D-3) 1000 UNITS CAPS Take 1 capsule by mouth daily.  . clopidogrel (PLAVIX) 75 MG tablet Take 75 mg by mouth daily with breakfast.  . docusate sodium (COLACE) 100 MG capsule Take 2 capsules (200 mg total) by mouth daily.  Marland Kitchen ezetimibe (ZETIA) 10 MG tablet Take 10 mg by mouth daily.  Marland Kitchen FLUZONE QUADRIVALENT 0.5 ML injection Inject 0.5 mLs into the muscle once.   Marland Kitchen HYDROcodone-acetaminophen (NORCO/VICODIN) 5-325 MG per tablet Take 0.5 tablets by mouth every 6 (six) hours as needed (Patient states that he takes 1/2 tablet as needed). for pain  . levothyroxine (SYNTHROID, LEVOTHROID) 150 MCG tablet Take 150 mcg by mouth daily before breakfast.  . lisinopril (PRINIVIL,ZESTRIL) 10 MG tablet Take 10 mg by mouth daily.  Marland Kitchen LORazepam (ATIVAN) 1 MG tablet Take 1 mg by mouth daily.  . metoprolol succinate (TOPROL-XL) 50 MG 24 hr tablet Take 50 mg by mouth daily.  Marland Kitchen omega-3 acid ethyl esters (LOVAZA) 1 G capsule Take 1 g by mouth 2 (two) times daily.   . ondansetron (ZOFRAN) 4 MG tablet Take 1 tablet (4 mg total) by mouth 2 (two) times daily as needed for nausea or vomiting.  . pantoprazole  (PROTONIX) 40 MG tablet Take 1 tablet (40 mg total) by mouth daily before breakfast.  . tamsulosin (FLOMAX) 0.4 MG CAPS capsule Take 0.4 mg by mouth as needed.   . Wheat Dextrin (BENEFIBER DRINK MIX) PACK Take 4 g by mouth at bedtime.     Objective: Blood pressure 130/70, pulse 74, temperature 97.6 F (36.4 C), temperature source Oral, resp. rate 18, height 5\' 9"  (1.753 m), weight 178 lb 8 oz (80.967 kg). Patient is alert and in no acute distress. Conjunctiva is pink. Sclera is nonicteric Oropharyngeal mucosa is normal. No neck masses or thyromegaly noted. Cardiac exam with regular rhythm normal S1 and S2. No murmur or gallop noted. Lungs are clear to auscultation. Abdomen is symmetrical soft and nontender without organomegaly or masses.  No LE edema or clubbing noted.  Labs/studies Results: Ultrasound on 10/04/2014 was negative for gallstones. Bile duct measures 6.7 mm without any stones. Bilateral renal cysts and small echogenic focus possibly a stone in right kidney but are her CT was negative.    Assessment:  #1. Nausea without vomiting possibly due to GERD. Significant symptomatic improvement with PPI. Ultrasound was negative for cholelithiasis. #2. Weight loss. He has not lost any weight since his last visit 4 weeks ago. However he has lost 8 pounds since his visit of 08/21/2014. He has undergone abdominopelvic CT 2 in the past and no lesion noted count for his weight loss. #3.  History of sigmoid diverticulitis. He was treated twice in December 2015 and now is symptom free.    Plan:  Continue pantoprazole at 40 mg by mouth every morning. Patient advised to take clopidogrel every evening since he takes pantoprazole in a.m. Weight check in 4 weeks. Office visit in 2 months.

## 2014-10-30 NOTE — Patient Instructions (Signed)
Remember to exercise and walk daily. Gradually increase distance to 1 mile at least 4 times a week if not daily. Can take Zyrtec OTC 1 daily on as-needed basis. Weight check in 4 weeks

## 2014-11-06 ENCOUNTER — Ambulatory Visit (INDEPENDENT_AMBULATORY_CARE_PROVIDER_SITE_OTHER): Payer: Medicare Other | Admitting: Internal Medicine

## 2014-12-04 ENCOUNTER — Ambulatory Visit (INDEPENDENT_AMBULATORY_CARE_PROVIDER_SITE_OTHER): Payer: Medicare Other | Admitting: Internal Medicine

## 2015-01-08 ENCOUNTER — Ambulatory Visit (INDEPENDENT_AMBULATORY_CARE_PROVIDER_SITE_OTHER): Payer: Medicare Other | Admitting: Internal Medicine

## 2015-01-08 ENCOUNTER — Encounter (INDEPENDENT_AMBULATORY_CARE_PROVIDER_SITE_OTHER): Payer: Self-pay | Admitting: Internal Medicine

## 2015-01-08 VITALS — BP 112/68 | HR 74 | Temp 97.5°F | Resp 18 | Ht 69.0 in | Wt 179.2 lb

## 2015-01-08 DIAGNOSIS — K219 Gastro-esophageal reflux disease without esophagitis: Secondary | ICD-10-CM

## 2015-01-08 DIAGNOSIS — R634 Abnormal weight loss: Secondary | ICD-10-CM

## 2015-01-08 NOTE — Patient Instructions (Addendum)
Check and record to wait once a week. Call if it drops by more than 5 pounds. Follow-up with physician at [facility] regarding back pain and lower extremity muscle weakness.

## 2015-01-08 NOTE — Progress Notes (Signed)
Presenting complaint;  Follow-up of weight loss. Patient has GERD and history of diverticulitis.  Subjective:  [place] 81 year old Caucasian male who is here for scheduled visit accompanied by his wife [place]. He was last seen on 10/30/2014. He states his appetite is back to normal. He is eating whatever he wants to. He generally has to meals and snack in between. He states he is active. He does yard work. He denies heartburn nausea vomiting melena or rectal bleeding. His bowels move regularly. He complains of back pain when he starts to walk and it subsides after a few minutes. 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 He does not feel he is having any side effects with atorvastatin. His wife is concerned about short-term memory loss.   Current Medications: Outpatient Encounter Prescriptions as of 01/08/2015  Medication Sig  . aspirin 81 MG tablet Take 81 mg by mouth daily.  Marland Kitchen atorvastatin (LIPITOR) 80 MG tablet Take 40 mg by mouth daily.   . Cholecalciferol (VITAMIN D-3) 1000 UNITS CAPS Take 1 capsule by mouth daily.  . clopidogrel (PLAVIX) 75 MG tablet Take 75 mg by mouth daily with supper.  . docusate sodium (COLACE) 100 MG capsule Take 2 capsules (200 mg total) by mouth daily. (Patient taking differently: Take 100 mg by mouth. )  . ezetimibe (ZETIA) 10 MG tablet Take 10 mg by mouth daily.  Marland Kitchen FLUZONE QUADRIVALENT 0.5 ML injection Inject 0.5 mLs into the muscle once.   Marland Kitchen levothyroxine (SYNTHROID, LEVOTHROID) 150 MCG tablet Take 150 mcg by mouth daily before breakfast.  . lisinopril (PRINIVIL,ZESTRIL) 10 MG tablet Take 10 mg by mouth daily.  Marland Kitchen LORazepam (ATIVAN) 1 MG tablet Take 1 mg by mouth daily.  . metoprolol succinate (TOPROL-XL) 50 MG 24 hr tablet Take 50 mg by mouth daily.  Marland Kitchen omega-3 acid ethyl esters (LOVAZA) 1 G capsule Take 1 g by mouth 2 (two) times daily.    . ondansetron (ZOFRAN) 4 MG tablet Take 1 tablet (4 mg total) by mouth 2 (two) times daily as needed for nausea or vomiting.  . pantoprazole (PROTONIX) 40 MG tablet Take 1 tablet (40 mg total) by mouth daily before breakfast.  . tamsulosin (FLOMAX) 0.4 MG CAPS capsule Take 0.4 mg by mouth as needed.   . Wheat Dextrin (BENEFIBER DRINK MIX) PACK Take 4 g by mouth at bedtime.  Marland Kitchen HYDROcodone-acetaminophen (NORCO/VICODIN) 5-325 MG per tablet Take 0.5 tablets by mouth every 6 (six) hours as needed (Patient states that he takes 1/2 tablet as needed). for pain (Patient not taking: Reported on 01/08/2015)     Objective: Blood pressure 112/68, pulse 74, temperature 97.5 F (36.4 C), temperature source Oral, resp. rate 18, height 5\' 9"  (1.753 m), weight 179 lb 3.2 oz (81.285 kg). Ration is alert and in no acute distress. Conjunctiva is pink. Sclera is nonicteric Oropharyngeal mucosa is normal. No neck masses or thyromegaly noted. Cardiac exam with regular rhythm normal S1 and S2. No murmur or gallop noted. Lungs are clear to auscultation. Abdomen is symmetrical soft and nontender without organomegaly or masses.  No LE edema or clubbing noted.   Assessment:  #1. Weight loss. He has not lost weight since his last visit. Will continue to monitor closely. His appetite is back to normal. #2. GERD. Heartburn is well controlled with therapy. #3. History of diverticulitis. Abdominal exam is benign. He was  treated twice in December 2015. #4. Back pain and lower extremity muscle wasting and weakness. He will follow-up with PCP.   Plan:  Patient advised to make an appointment to see PCP regarding his back pain and lower extremity muscle a standing and weakness. Patient will call if he loses more than 5 pounds. Office visit in 6 months.

## 2015-07-09 ENCOUNTER — Encounter (INDEPENDENT_AMBULATORY_CARE_PROVIDER_SITE_OTHER): Payer: Self-pay | Admitting: Internal Medicine

## 2015-07-09 ENCOUNTER — Ambulatory Visit (INDEPENDENT_AMBULATORY_CARE_PROVIDER_SITE_OTHER): Payer: Medicare Other | Admitting: Internal Medicine

## 2015-07-09 VITALS — BP 112/76 | HR 68 | Temp 97.7°F | Resp 16 | Ht 69.0 in | Wt 177.0 lb

## 2015-07-09 DIAGNOSIS — R634 Abnormal weight loss: Secondary | ICD-10-CM

## 2015-07-09 DIAGNOSIS — K219 Gastro-esophageal reflux disease without esophagitis: Secondary | ICD-10-CM

## 2015-07-09 DIAGNOSIS — R109 Unspecified abdominal pain: Secondary | ICD-10-CM | POA: Diagnosis not present

## 2015-07-09 NOTE — Patient Instructions (Signed)
Physician will call with results of urinalysis. 

## 2015-07-09 NOTE — Progress Notes (Signed)
Presenting complaint;  Follow-up for weight loss and GERD. Patient complains of right flank pain.  Subjective:  Rate is 81 year old Caucasian male who is here for scheduled visit. He was last seen 6 months ago. He states he's been tracking his weight at home and it hasn't changed since his last visit. According to our records he has lost 2 pounds. He states his appetite is normal. He generally has 2 meals and snacks in between. His bowels move every day. He has not experienced nausea or vomiting. He feels heartburns well controlled with PPI and he denies dysphagia. His new complaint is one of the right flank pain which she's had for the last couple weeks. He says he has had no pain today. There is no history of falling hematuria or dysuria. He gives a remote history of kidney stones.   Current Medications: Outpatient Encounter Prescriptions as of 07/09/2015  Medication Sig  . aspirin 81 MG tablet Take 81 mg by mouth daily.  Marland Kitchen atorvastatin (LIPITOR) 80 MG tablet Take 40 mg by mouth daily.   . Cholecalciferol (VITAMIN D-3) 1000 UNITS CAPS Take 1 capsule by mouth daily.  . clopidogrel (PLAVIX) 75 MG tablet Take 75 mg by mouth daily with supper.  . docusate sodium (COLACE) 100 MG capsule Take 2 capsules (200 mg total) by mouth daily. (Patient taking differently: Take 100 mg by mouth. )  . ezetimibe (ZETIA) 10 MG tablet Take 10 mg by mouth daily.  Marland Kitchen FLUZONE QUADRIVALENT 0.5 ML injection Inject 0.5 mLs into the muscle once.   Marland Kitchen levothyroxine (SYNTHROID, LEVOTHROID) 150 MCG tablet Take 150 mcg by mouth daily before breakfast.  . lisinopril (PRINIVIL,ZESTRIL) 10 MG tablet Take 10 mg by mouth daily.  Marland Kitchen LORazepam (ATIVAN) 1 MG tablet Take 1 mg by mouth daily.  . metoprolol succinate (TOPROL-XL) 50 MG 24 hr tablet Take 50 mg by mouth daily.  Marland Kitchen omega-3 acid ethyl esters (LOVAZA) 1 G capsule Take 1 g by mouth 2 (two) times daily.   . ondansetron (ZOFRAN) 4 MG tablet Take 1 tablet (4 mg total) by mouth 2  (two) times daily as needed for nausea or vomiting.  . pantoprazole (PROTONIX) 40 MG tablet Take 1 tablet (40 mg total) by mouth daily before breakfast.  . tamsulosin (FLOMAX) 0.4 MG CAPS capsule Take 0.4 mg by mouth as needed.   . Wheat Dextrin (BENEFIBER DRINK MIX) PACK Take 4 g by mouth at bedtime.   No facility-administered encounter medications on file as of 07/09/2015.     Objective: Blood pressure 112/76, pulse 68, temperature 97.7 F (36.5 C), temperature source Oral, resp. rate 16, height 5\' 9"  (1.753 m), weight 177 lb (80.287 kg). Patient is alert and in no acute distress. He has small ecchymosis below the right eye. Conjunctiva is pink. Sclera is nonicteric Oropharyngeal mucosa is normal. No neck masses or thyromegaly noted. Cardiac exam with regular rhythm normal S1 and S2. No murmur or gallop noted. Lungs are clear to auscultation. Abdomen is symmetrical soft and nontender. No organomegaly or masses. Mild tenderness noted over right renal angle.  No LE edema or clubbing noted.    Assessment:  #1. Weight loss. Weight loss has leveled off. I believe his weight loss is due to diminished caloric intake. Will continue to monitor his weight. #2. GERD. Heartburns well controlled with therapy. #3. Right flank pain. He has remote history of kidney stone. Pain may well be musculoskeletal. Will check urine to make sure he does not have immature oriented.  I see no need further evaluation.   Plan:  Urinalysis. Office visit in 6 months unless his weight drops by more than 5 pounds.

## 2015-07-10 ENCOUNTER — Telehealth (INDEPENDENT_AMBULATORY_CARE_PROVIDER_SITE_OTHER): Payer: Self-pay | Admitting: *Deleted

## 2015-07-10 LAB — URINALYSIS, ROUTINE W REFLEX MICROSCOPIC
Bilirubin Urine: NEGATIVE
GLUCOSE, UA: NEGATIVE
Hgb urine dipstick: NEGATIVE
Ketones, ur: NEGATIVE
Leukocytes, UA: NEGATIVE
Nitrite: NEGATIVE
PH: 5.5 (ref 5.0–8.0)
Protein, ur: NEGATIVE
Specific Gravity, Urine: 1.013 (ref 1.001–1.035)

## 2015-07-10 NOTE — Telephone Encounter (Signed)
Per Dr.Rehman the patient 's PCP is at [place]. Given the symptoms and the severity of the pain ,he advises the patient should go to the ED to be evaluated. Patient was called and advised of this and also the results of his U/A done 07/09/15. Patient is agreeable to go to the ED.

## 2015-07-10 NOTE — Telephone Encounter (Signed)
Just seen Dr. Laural Golden yesterday and the pain has moved around to his side. He is having a lot of trouble.  Wife would like to speak the nurse or Dr Laural Golden. The return phone number is [phone].

## 2015-07-10 NOTE — Telephone Encounter (Signed)
I have talked with Anthony Harrell. She states that AnthonyHarrell has been up all night with the pain in his back and it is now moved around to his right side. She mentions like his appendix. She also says that his symptoms are not like they have been when he has passed kidney stone. Patient is wanting to come back to our office today to see Dr.Rehman. I explained that Dr.Rehman is out of office till Monday, and ask if he could see PCP in [place]. Will address with Dr.Rehman.

## 2015-10-31 ENCOUNTER — Encounter (INDEPENDENT_AMBULATORY_CARE_PROVIDER_SITE_OTHER): Payer: Self-pay | Admitting: *Deleted

## 2015-12-23 ENCOUNTER — Telehealth (INDEPENDENT_AMBULATORY_CARE_PROVIDER_SITE_OTHER): Payer: Self-pay | Admitting: *Deleted

## 2015-12-23 NOTE — Telephone Encounter (Signed)
Patient called and left the following message. Rec'd letter about Colonoscopy. Has [facility] and there has been no changes in his medications since last visit here. He ask for a call back @ [phone].

## 2015-12-25 ENCOUNTER — Other Ambulatory Visit (INDEPENDENT_AMBULATORY_CARE_PROVIDER_SITE_OTHER): Payer: Self-pay | Admitting: *Deleted

## 2015-12-25 DIAGNOSIS — Z86010 Personal history of colonic polyps: Secondary | ICD-10-CM

## 2015-12-25 NOTE — Telephone Encounter (Signed)
TCS sch'd 03/18/16, patient aware

## 2016-01-21 ENCOUNTER — Encounter (INDEPENDENT_AMBULATORY_CARE_PROVIDER_SITE_OTHER): Payer: Self-pay | Admitting: Internal Medicine

## 2016-01-21 ENCOUNTER — Ambulatory Visit (INDEPENDENT_AMBULATORY_CARE_PROVIDER_SITE_OTHER): Payer: Medicare Other | Admitting: Internal Medicine

## 2016-01-28 ENCOUNTER — Encounter (INDEPENDENT_AMBULATORY_CARE_PROVIDER_SITE_OTHER): Payer: Self-pay | Admitting: Internal Medicine

## 2016-01-28 ENCOUNTER — Ambulatory Visit (INDEPENDENT_AMBULATORY_CARE_PROVIDER_SITE_OTHER): Payer: Medicare Other | Admitting: Internal Medicine

## 2016-01-28 VITALS — BP 120/70 | HR 68 | Temp 97.2°F | Resp 18 | Ht 69.0 in | Wt 180.9 lb

## 2016-01-28 DIAGNOSIS — Z86010 Personal history of colonic polyps: Secondary | ICD-10-CM | POA: Diagnosis not present

## 2016-01-28 DIAGNOSIS — K5900 Constipation, unspecified: Secondary | ICD-10-CM | POA: Diagnosis not present

## 2016-01-28 DIAGNOSIS — K219 Gastro-esophageal reflux disease without esophagitis: Secondary | ICD-10-CM

## 2016-01-28 MED ORDER — POLYETHYLENE GLYCOL 3350 17 GM/SCOOP PO POWD: 17.0000 g | Freq: Once | ORAL | Status: DC

## 2016-01-28 NOTE — Progress Notes (Signed)
Presenting complaint;  Follow-up for GERD and weight loss. Patient complains of constipation.  Subjective:  Patient is 82 year old Caucasian male who is here for scheduled visit. He was last seen on 07/09/2015. He feels better. He has occasional nausea. His appetite has improved. He has gained 3 pounds is his last visit. He complains of constipation. He states he can go days without a bowel movement. He has noted decreased caliber for stools. He is not taking fiber supplements. He does not remember the last time he took polyethylene glycol. He states it started few years ago. He denies melena or rectal bleeding. Heartburn is well controlled with therapy. He denies dysphagia. He has been having pain in his left shoulder as well as in his back. He had steroid shot to left shoulder in March and more recently had steroid shot to his back and it has helped. He had MRI about 2 weeks ago revealing multiple abnormalities and has been rereferred to back specialist for pain injections. He provided copy of blood work and MRI report for review.   Current Medications: Outpatient Encounter Prescriptions as of 01/28/2016  Medication Sig  . aspirin 81 MG tablet Take 81 mg by mouth daily.  Marland Kitchen atorvastatin (LIPITOR) 80 MG tablet Take 40 mg by mouth daily.   . Cholecalciferol (VITAMIN D-3) 1000 UNITS CAPS Take 1 capsule by mouth daily.  . clobetasol (TEMOVATE) 0.05 % external solution Apply 1 application topically daily.   . clopidogrel (PLAVIX) 75 MG tablet Take 75 mg by mouth daily with supper.  . docusate sodium (COLACE) 100 MG capsule Take 2 capsules (200 mg total) by mouth daily. (Patient taking differently: Take 100 mg by mouth 2 (two) times daily. )  . ezetimibe (ZETIA) 10 MG tablet Take 10 mg by mouth daily.  Marland Kitchen FLUZONE QUADRIVALENT 0.5 ML injection Inject 0.5 mLs into the muscle once.   Marland Kitchen levothyroxine (SYNTHROID, LEVOTHROID) 150 MCG tablet Take 150 mcg by mouth daily before breakfast.  . lisinopril  (PRINIVIL,ZESTRIL) 10 MG tablet Take 10 mg by mouth daily.  Marland Kitchen LORazepam (ATIVAN) 1 MG tablet Take 1 mg by mouth daily.  . metoprolol succinate (TOPROL-XL) 50 MG 24 hr tablet Take 25 mg by mouth daily.   . nitroGLYCERIN (NITROSTAT) 0.4 MG SL tablet Place 0.4 mg under the tongue every 5 (five) minutes as needed.   Marland Kitchen omega-3 acid ethyl esters (LOVAZA) 1 G capsule Take 1 g by mouth 2 (two) times daily.   . ondansetron (ZOFRAN) 4 MG tablet Take 1 tablet (4 mg total) by mouth 2 (two) times daily as needed for nausea or vomiting.  . pantoprazole (PROTONIX) 40 MG tablet Take 1 tablet (40 mg total) by mouth daily before breakfast.  . tamsulosin (FLOMAX) 0.4 MG CAPS capsule Take 0.4 mg by mouth as needed.   . Wheat Dextrin (BENEFIBER DRINK MIX) PACK Take 4 g by mouth at bedtime. (Patient not taking: Reported on 01/28/2016)   No facility-administered encounter medications on file as of 01/28/2016.     Objective: Blood pressure 120/70, pulse 68, temperature 97.2 F (36.2 C), temperature source Oral, resp. rate 18, height 5\' 9"  (1.753 m), weight 180 lb 14.4 oz (82.056 kg). Patient is alert and in no acute distress. Has hearing impairment. Conjunctiva is pink. Sclera is nonicteric Oropharyngeal mucosa is normal. No neck masses or thyromegaly noted. Cardiac exam with regular rhythm normal S1 and S2. No murmur or gallop noted. Lungs are clear to auscultation. Abdomen is soft and nontender without organomegaly or  masses. No LE edema or clubbing noted.  Labs/studies Results: Blood work from 02-Sep-2015 provided by the patient  WBC 7.06, H&H 12.6 and 39.8 and platelet count 148K. Serum creatinine 0.97 Potassium 4.6 Calcium 9.1 Bilirubin 0.8, AP 58, AST 22, ALT 22 and albumin 3.5.  MRI report from 01/14/2016 reviewed He has multi-disc bulges as well as facet osteosis hypertrophia with varying degrees of for omental narrowing. He also has central canal stenosis at L4/L5 and multiple other abnormalities.     Assessment:  #1. Chronic GERD. He is doing well with therapy. Also has history of peptic ulcer disease and therefore needs to be and PPI for more than one reason. #2. Weight loss. It is reassuring to note that weight loss is reversed and he has gained 3 pounds. #3. Constipation is secondary to his medications or colonic dysmotility. He needs to be on scheduled medications rather than when necessary. #4.  History of colonic adenomas. Last colonoscopy was in January 2012. Patient is interested in another surveillance colonoscopy and has been scheduled to undergo procedure in July 2017.   Plan:  Patient advised to take fiber supplement daily.. Discontinue Colace. Polyethylene glycol 8.5 to 17 g by mouth daily at bedtime. Surveillance colonoscopy in July 2017. Patient will continue aspirin but stopped clopidogrel for 5 days prior to the procedure. Physician visit in one year.

## 2016-01-28 NOTE — Patient Instructions (Addendum)
Take Benefiber by mouth 4 g daily at bedtime. Polyethylene glycol 8.5 to 17 g by mouth daily at bedtime. Take clopidogrel in the evening since you take pantoprazole in the morning.

## 2016-02-19 ENCOUNTER — Encounter (INDEPENDENT_AMBULATORY_CARE_PROVIDER_SITE_OTHER): Payer: Self-pay

## 2016-02-21 ENCOUNTER — Other Ambulatory Visit (INDEPENDENT_AMBULATORY_CARE_PROVIDER_SITE_OTHER): Payer: Self-pay | Admitting: *Deleted

## 2016-02-21 ENCOUNTER — Encounter (INDEPENDENT_AMBULATORY_CARE_PROVIDER_SITE_OTHER): Payer: Self-pay | Admitting: *Deleted

## 2016-02-21 NOTE — Telephone Encounter (Signed)
Patient needs trilyte 

## 2016-02-24 MED ORDER — PEG 3350-KCL-NA BICARB-NACL 420 G PO SOLR: 4000.0000 mL | Freq: Once | ORAL | Status: DC

## 2016-03-04 ENCOUNTER — Telehealth (INDEPENDENT_AMBULATORY_CARE_PROVIDER_SITE_OTHER): Payer: Self-pay | Admitting: *Deleted

## 2016-03-04 NOTE — Telephone Encounter (Signed)
Referring MD/PCP:    Procedure: tcs  Reason/Indication:  Hx polyps  Has patient had this procedure before?  Yes, 2012  If so, when, by whom and where?    Is there a family history of colon cancer?  no  Who?  What age when diagnosed?    Is patient diabetic?   no      Does patient have prosthetic heart valve or mechanical valve?  no  Do you have a pacemaker?  no  Has patient ever had endocarditis? no  Has patient had joint replacement within last 12 months?  no  Does patient tend to be constipated or take laxatives? yes  Does patient have a history of alcohol/drug use?  no  Is patient on Coumadin, Plavix and/or Aspirin? yes  Medications: see epic  Allergies: nkda  Medication Adjustment: asa 2 days, plavix 5 days  Procedure date & time: 04/01/16 at 1030

## 2016-03-11 IMAGING — CT CT ABD-PELV W/ CM
2 of 5 series · 16 of 46 positions shown, 18 images · IV contrast (Omnipaque 300)
Comparison: None.

CLINICAL DATA: Low abdominal pain after fall 3 weeks prior.

EXAM:
CT ABDOMEN AND PELVIS WITH CONTRAST
TECHNIQUE: Multidetector CT imaging of the abdomen and pelvis was performed
using the standard protocol following bolus administration of
intravenous contrast.
CONTRAST:  100mL OMNIPAQUE IOHEXOL 300 MG/ML  SOLN

[Series 2: abd_pel_with 5.0 b40f · axial · 0.71mm/px · z∈[-377,+28]mm · 13 of 91 slices shown, 15 images]
[im 5/91  soft-tissue]
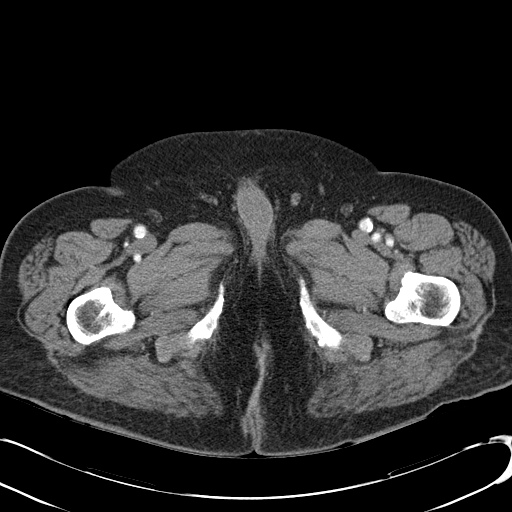
[im 5/91  bone]
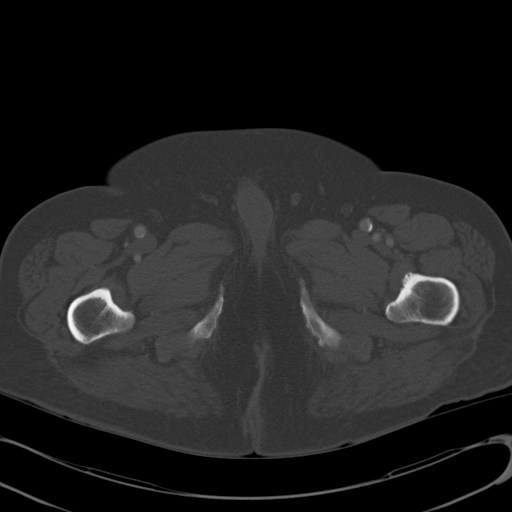
[im 15/91  soft-tissue]
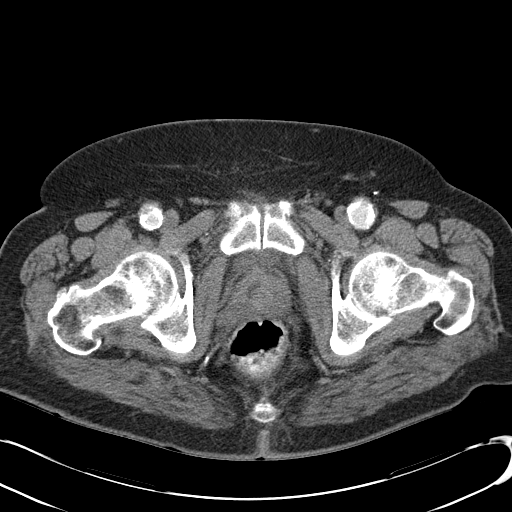
[im 19/91  soft-tissue]
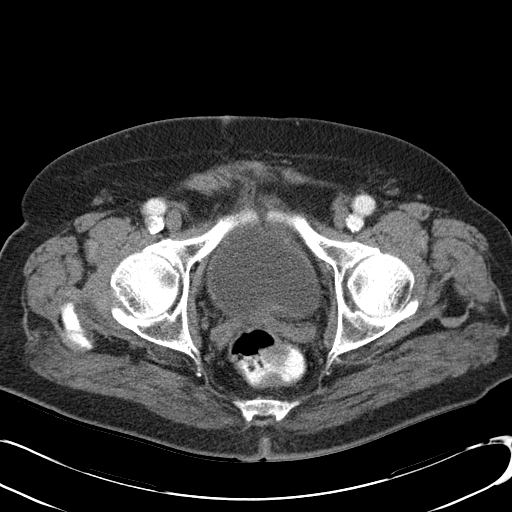
[im 24/91  soft-tissue]
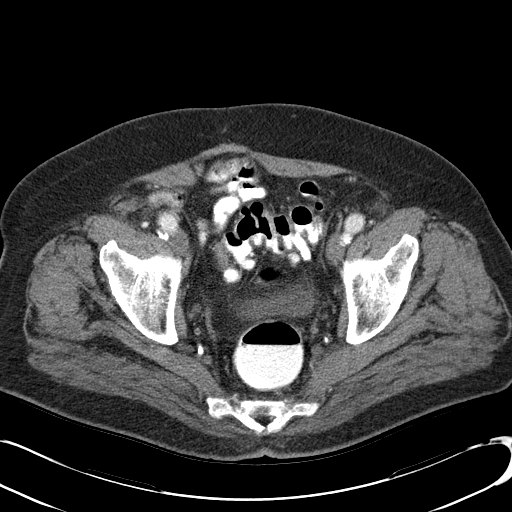
[im 34/91  soft-tissue]
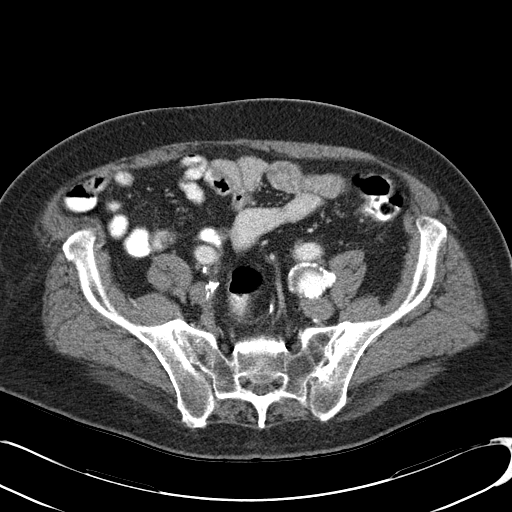
[im 38/91  soft-tissue]
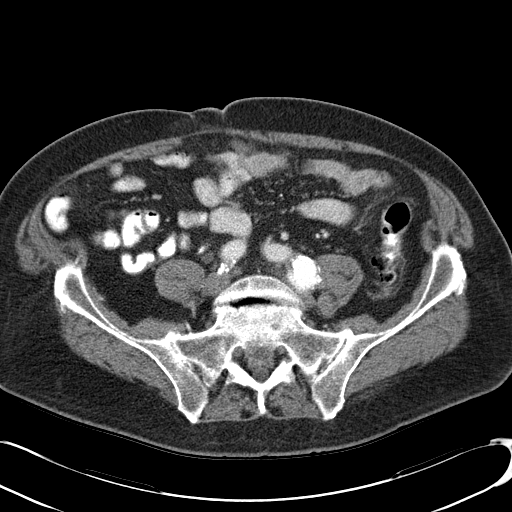
[im 48/91  soft-tissue]
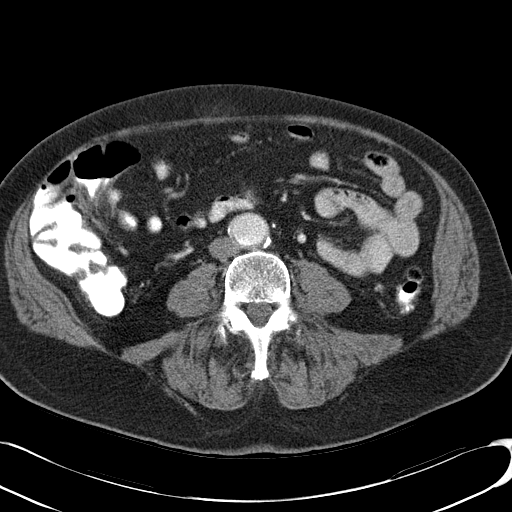
[im 53/91  soft-tissue]
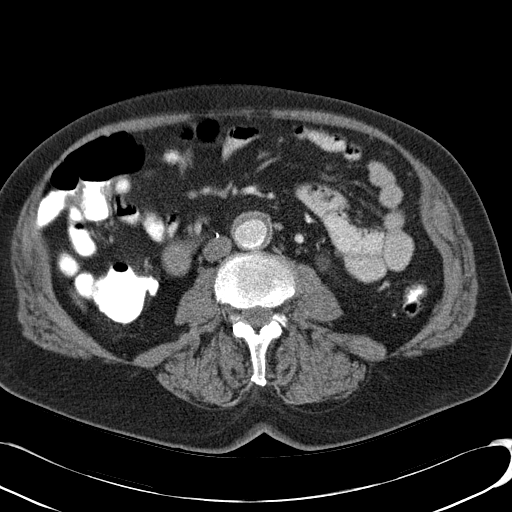
[im 57/91  soft-tissue]
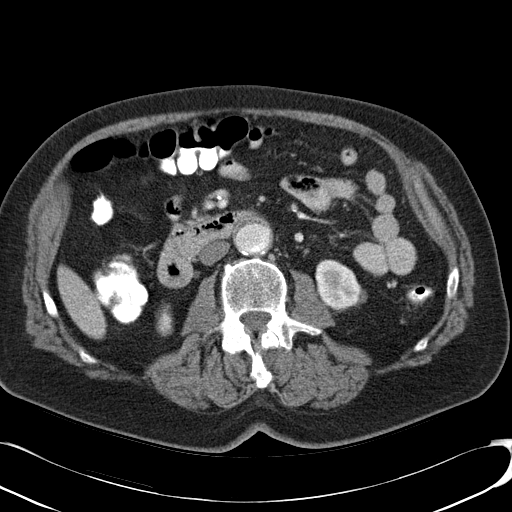
[im 57/91  bone]
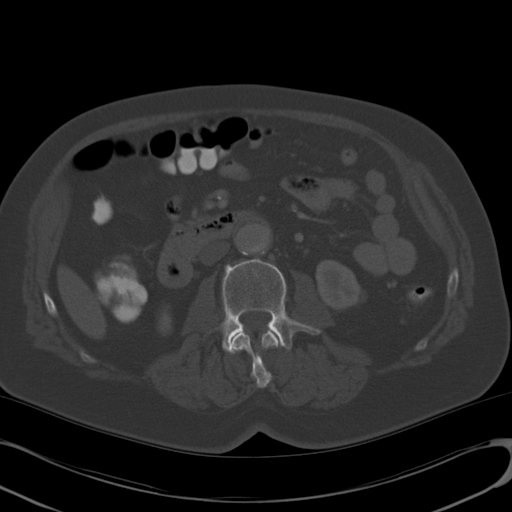
[im 67/91  soft-tissue]
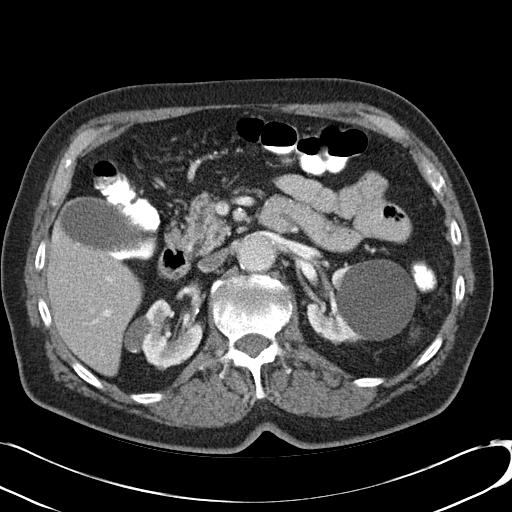
[im 72/91  soft-tissue]
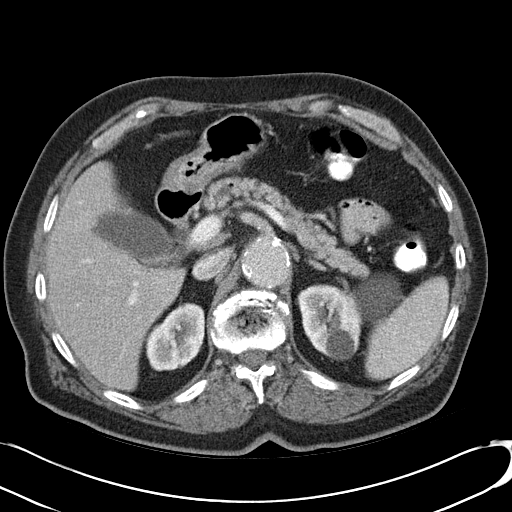
[im 76/91  soft-tissue]
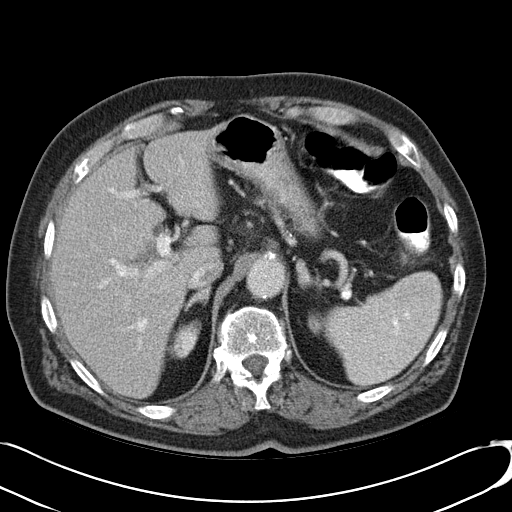
[im 86/91  soft-tissue]
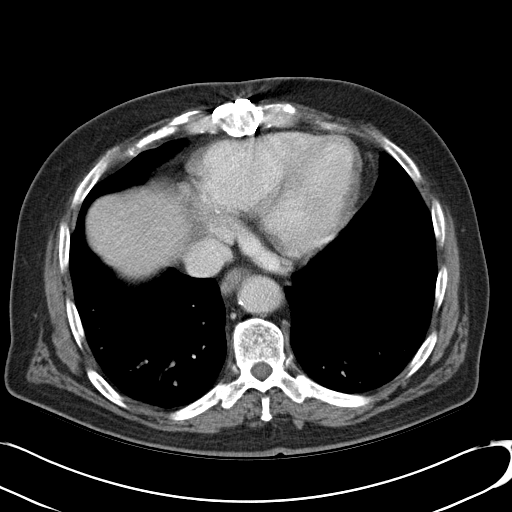

[Series 5: abd_pel_with 3.0 spo · coronal · 0.75mm/px · 3 of 87 slices shown]
[im 29/87  soft-tissue]
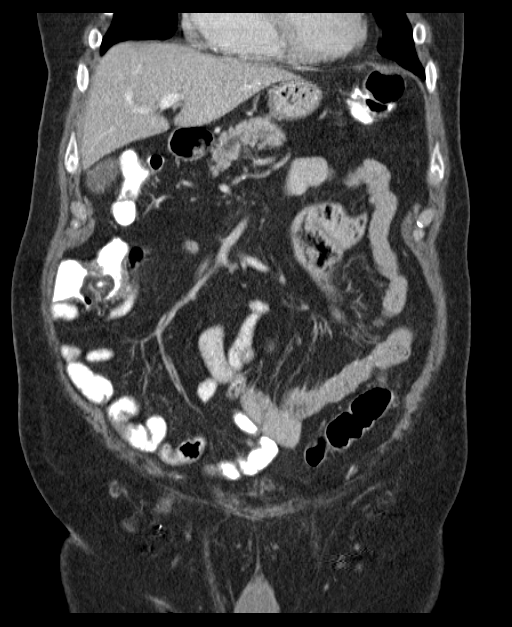
[im 39/87  soft-tissue]
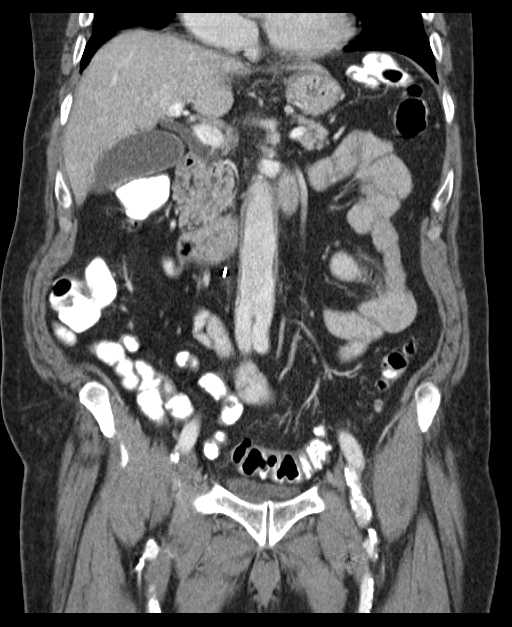
[im 48/87  soft-tissue]
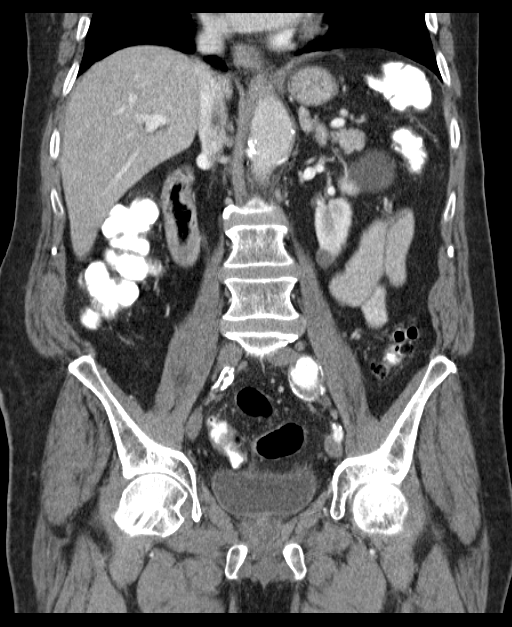

[16 of 46 positions shown; findings below may reference images not displayed]

FINDINGS: Lower chest:  Lung bases are clear.  No pericardial fluid

Hepatobiliary: No focal hepatic lesion.  Normal gallbladder.

Pancreas: common bile duct mildly dilated 8 mm. The pancreatic duct
is prominent measuring 5 mm through the neck and head. There is
small focus of gas within the pancreatic duct in the proximal body
region on image 19, series 2. Small bubbles of unclear etiology.
Query Sphincterotomy. Body and tail of the pancreas are otherwise
normal.

Spleen: Normal spleen.

Adrenals/urinary tract: Adrenal glands are normal. Bilateral
low-density renal lesions this consistent with a cyst. And the
swallowed a lesions exophytic on the right measuring 2.4 cm (image
24, series 2) and does not have simple fluid attenuation.

Stomach/Bowel: Stomach, small bowel, appendix, and cecum are normal.
There are diverticula of the descending colon sigmoid colon without
acute inflammation. Contrast reaches the rectum.

Vascular/Lymphatic: Mild saccular aneurysm of the infrarenal
abdominal aorta to 32 mm above the with surgical repair of the
abdominal aorta. There is a aorta by femoral bypass graft. There is
a saccular aneurysm of the distal left common iliac artery to 27 mm.

Reproductive: Prostate gland is normal.  No pelvic lymphadenopathy.

Musculoskeletal: There is a fracture of the superior endplate of the
L1 vertebral body which appears chronic as there is gas within the
disc space at T12-L1. There approximately 20% loss vertebral body
height this level.
IMPRESSION: 1. Superior endplate compression fracture L1 is age-indeterminate
but favor chronic. No retropulsion or paraspinal hematoma.
2. Extensive sigmoid diverticulosis without clear evidence of acute
diverticulitis. Cannot exclude mild acute diverticulitis.
3. Dilatation of the common bile duct and the distal pancreatic
duct. Consider MRCP for evaluation
4. Aorta bi-iliac graft repair with aneurysm of the distal left
internal iliac artery to 28 mm.

## 2016-03-11 NOTE — Telephone Encounter (Signed)
agree

## 2016-04-01 ENCOUNTER — Ambulatory Visit (HOSPITAL_COMMUNITY)
Admission: RE | Admit: 2016-04-01 | Discharge: 2016-04-01 | Disposition: A | Discharge status: Home / Self Care | Payer: Medicare Other | Source: Ambulatory Visit | Attending: Internal Medicine | Admitting: Internal Medicine

## 2016-04-01 ENCOUNTER — Encounter (HOSPITAL_COMMUNITY): Payer: Self-pay | Admitting: *Deleted

## 2016-04-01 ENCOUNTER — Encounter (HOSPITAL_COMMUNITY)
Admission: RE | Disposition: A | Discharge status: Home / Self Care | Payer: Self-pay | Source: Ambulatory Visit | Attending: Internal Medicine

## 2016-04-01 DIAGNOSIS — E7800 Pure hypercholesterolemia, unspecified: Secondary | ICD-10-CM | POA: Diagnosis not present

## 2016-04-01 DIAGNOSIS — K5730 Diverticulosis of large intestine without perforation or abscess without bleeding: Secondary | ICD-10-CM | POA: Diagnosis not present

## 2016-04-01 DIAGNOSIS — Z87891 Personal history of nicotine dependence: Secondary | ICD-10-CM | POA: Diagnosis not present

## 2016-04-01 DIAGNOSIS — Z79899 Other long term (current) drug therapy: Secondary | ICD-10-CM | POA: Diagnosis not present

## 2016-04-01 DIAGNOSIS — K5900 Constipation, unspecified: Secondary | ICD-10-CM | POA: Diagnosis not present

## 2016-04-01 DIAGNOSIS — Z7982 Long term (current) use of aspirin: Secondary | ICD-10-CM | POA: Insufficient documentation

## 2016-04-01 DIAGNOSIS — D124 Benign neoplasm of descending colon: Secondary | ICD-10-CM | POA: Diagnosis not present

## 2016-04-01 DIAGNOSIS — I10 Essential (primary) hypertension: Secondary | ICD-10-CM | POA: Diagnosis not present

## 2016-04-01 DIAGNOSIS — Z955 Presence of coronary angioplasty implant and graft: Secondary | ICD-10-CM | POA: Diagnosis not present

## 2016-04-01 DIAGNOSIS — D122 Benign neoplasm of ascending colon: Secondary | ICD-10-CM | POA: Diagnosis not present

## 2016-04-01 DIAGNOSIS — F419 Anxiety disorder, unspecified: Secondary | ICD-10-CM | POA: Insufficient documentation

## 2016-04-01 DIAGNOSIS — Z86010 Personal history of colonic polyps: Secondary | ICD-10-CM | POA: Insufficient documentation

## 2016-04-01 DIAGNOSIS — K219 Gastro-esophageal reflux disease without esophagitis: Secondary | ICD-10-CM | POA: Insufficient documentation

## 2016-04-01 DIAGNOSIS — Z1211 Encounter for screening for malignant neoplasm of colon: Secondary | ICD-10-CM | POA: Diagnosis present

## 2016-04-01 DIAGNOSIS — K644 Residual hemorrhoidal skin tags: Secondary | ICD-10-CM | POA: Diagnosis not present

## 2016-04-01 DIAGNOSIS — D120 Benign neoplasm of cecum: Secondary | ICD-10-CM | POA: Insufficient documentation

## 2016-04-01 DIAGNOSIS — E039 Hypothyroidism, unspecified: Secondary | ICD-10-CM | POA: Insufficient documentation

## 2016-04-01 DIAGNOSIS — I252 Old myocardial infarction: Secondary | ICD-10-CM | POA: Insufficient documentation

## 2016-04-01 DIAGNOSIS — Z7902 Long term (current) use of antithrombotics/antiplatelets: Secondary | ICD-10-CM | POA: Diagnosis not present

## 2016-04-01 DIAGNOSIS — Z09 Encounter for follow-up examination after completed treatment for conditions other than malignant neoplasm: Secondary | ICD-10-CM | POA: Diagnosis not present

## 2016-04-01 HISTORY — PX: COLONOSCOPY: SHX5424

## 2016-04-01 SURGERY — COLONOSCOPY
Anesthesia: Moderate Sedation

## 2016-04-01 MED ORDER — MIDAZOLAM HCL 5 MG/5ML IJ SOLN
INTRAMUSCULAR | Status: AC
  Filled 2016-04-01: qty 10

## 2016-04-01 MED ORDER — SODIUM CHLORIDE 0.9 % IV SOLN
INTRAVENOUS | Status: DC
  Administered 2016-04-01: 10:00:00 via INTRAVENOUS

## 2016-04-01 MED ORDER — MEPERIDINE HCL 50 MG/ML IJ SOLN
INTRAMUSCULAR | Status: DC | PRN
  Administered 2016-04-01 (×2): 25 mg via INTRAVENOUS

## 2016-04-01 MED ORDER — MIDAZOLAM HCL 5 MG/5ML IJ SOLN
INTRAMUSCULAR | Status: DC | PRN
  Administered 2016-04-01 (×2): 1 mg via INTRAVENOUS

## 2016-04-01 MED ORDER — MEPERIDINE HCL 50 MG/ML IJ SOLN
INTRAMUSCULAR | Status: AC
  Filled 2016-04-01: qty 1

## 2016-04-01 NOTE — H&P (Signed)
Anthony Harrell is an 82 y.o. male.   Chief Complaint: Patient is here for colonoscopy. HPI: She is a 82 year old Caucasian male who has history of colonic polyps and is here for surveillance colonoscopy. He denies abdominal pain change in bowel habits. He has constipation unresponsive to therapy. Last colonoscopy was in January 2012 with removal of 5 small adenomas. Family history is negative for CRC.  Past Medical History  Diagnosis Date  . High cholesterol   . MI, old   . Hypothyroidism   . GERD (gastroesophageal reflux disease)   . Hypertension   . Anxiety     Past Surgical History  Procedure Laterality Date  . 3 open heart surgeries    . Peri rectal abscess    . Stomach aneurysm surgery  (6cm)    . Coronary angioplasty with stent placement      10 yrs ago.  . Esophagogastroduodenoscopy N/A 12/27/2013    Procedure: ESOPHAGOGASTRODUODENOSCOPY (EGD);  Surgeon: Rogene Houston, MD;  Location: [facility];  Service: Endoscopy;  Laterality: N/A;  145  . Coronary artery bypass graft    . Colonoscopy      History reviewed. No pertinent family history. Social History:  reports that he has quit smoking. His smoking use included Cigarettes. He has a 50 pack-year smoking history. He has never used smokeless tobacco. He reports that he does not drink alcohol or use illicit drugs.  Allergies: No Known Allergies  Medications Prior to Admission  Medication Sig Dispense Refill  . aspirin 81 MG tablet Take 81 mg by mouth daily.    Marland Kitchen atorvastatin (LIPITOR) 80 MG tablet Take 40 mg by mouth daily.     . clopidogrel (PLAVIX) 75 MG tablet Take 75 mg by mouth daily with supper.    . co-enzyme Q-10 30 MG capsule Take 30 mg by mouth 3 (three) times daily.    Marland Kitchen docusate sodium (COLACE) 100 MG capsule Take 2 capsules (200 mg total) by mouth daily. (Patient taking differently: Take 100 mg by mouth 2 (two) times daily. ) 10 capsule 0  . ezetimibe (ZETIA) 10 MG tablet Take 10 mg by mouth daily.    Marland Kitchen  levothyroxine (SYNTHROID, LEVOTHROID) 150 MCG tablet Take 150 mcg by mouth daily before breakfast.    . lisinopril (PRINIVIL,ZESTRIL) 10 MG tablet Take 10 mg by mouth daily.  9  . LORazepam (ATIVAN) 1 MG tablet Take 1 mg by mouth daily.    . metoprolol succinate (TOPROL-XL) 50 MG 24 hr tablet Take 25 mg by mouth daily.   3  . omega-3 acid ethyl esters (LOVAZA) 1 G capsule Take 1 g by mouth 2 (two) times daily.     . polyethylene glycol powder (GLYCOLAX/MIRALAX) powder Take 17 g by mouth once. 500 g 5  . polyethylene glycol-electrolytes (TRILYTE) 420 g solution Take 4,000 mLs by mouth once. 4000 mL 0  . tamsulosin (FLOMAX) 0.4 MG CAPS capsule Take 0.4 mg by mouth as needed.     . Wheat Dextrin (BENEFIBER DRINK MIX) PACK Take 4 g by mouth at bedtime.    . Cholecalciferol (VITAMIN D-3) 1000 UNITS CAPS Take 1 capsule by mouth daily.    . clobetasol (TEMOVATE) 0.05 % external solution Apply 1 application topically daily.     Marland Kitchen FLUZONE QUADRIVALENT 0.5 ML injection Inject 0.5 mLs into the muscle once.   0  . nitroGLYCERIN (NITROSTAT) 0.4 MG SL tablet Place 0.4 mg under the tongue every 5 (five) minutes as needed.     Marland Kitchen  ondansetron (ZOFRAN) 4 MG tablet Take 1 tablet (4 mg total) by mouth 2 (two) times daily as needed for nausea or vomiting. 30 tablet 1  . pantoprazole (PROTONIX) 40 MG tablet Take 1 tablet (40 mg total) by mouth daily before breakfast. 30 tablet 5    No results found for this or any previous visit (from the past 48 hour(s)). No results found.  ROS  Blood pressure 152/61, pulse 60, temperature 97.7 F (36.5 C), temperature source Oral, resp. rate 18, height 5\' 9"  (1.753 m), weight 175 lb (79.379 kg), SpO2 98 %. Physical Exam  Constitutional: He appears well-developed and well-nourished.  HENT:  Mouth/Throat: Oropharynx is clear and moist.  Eyes: Conjunctivae are normal. No scleral icterus.  Neck: No thyromegaly present.  Cardiovascular: Normal rate, regular rhythm and normal  heart sounds.   No murmur heard. Respiratory: Effort normal and breath sounds normal.  GI: Soft. He exhibits no distension and no mass. There is no tenderness.  Musculoskeletal: He exhibits no edema.  Lymphadenopathy:    He has no cervical adenopathy.  Neurological: He is alert.  Skin: Skin is warm and dry.     Assessment/Plan History of colonic adenomas. Surveillance colonoscopy.  Hildred Laser, MD 04/01/2016, 10:12 AM

## 2016-04-01 NOTE — Discharge Instructions (Signed)
Resume aspirin and clopidogrel on 04/02/2016. Resume other medications as before. High fiber diet. No driving for 24 hours. Physician will call with biopsy results.  Colonoscopy, Care After Refer to this sheet in the next few weeks. These instructions provide you with information on caring for yourself after your procedure. Your health care provider may also give you more specific instructions. Your treatment has been planned according to current medical practices, but problems sometimes occur. Call your health care provider if you have any problems or questions after your procedure. WHAT TO EXPECT AFTER THE PROCEDURE  After your procedure, it is typical to have the following:  A small amount of blood in your stool.  Moderate amounts of gas and mild abdominal cramping or bloating. HOME CARE INSTRUCTIONS  Do not drive, operate machinery, or sign important documents for 24 hours.  You may shower and resume your regular physical activities, but move at a slower pace for the first 24 hours.  Take frequent rest periods for the first 24 hours.  Walk around or put a warm pack on your abdomen to help reduce abdominal cramping and bloating.  Drink enough fluids to keep your urine clear or pale yellow.  You may resume your normal diet as instructed by your health care provider. Avoid heavy or fried foods that are hard to digest.  Avoid drinking alcohol for 24 hours or as instructed by your health care provider.  Only take over-the-counter or prescription medicines as directed by your health care provider.  If a tissue sample (biopsy) was taken during your procedure:  Do not take aspirin or blood thinners for 7 days, or as instructed by your health care provider.  Do not drink alcohol for 7 days, or as instructed by your health care provider.  Eat soft foods for the first 24 hours. SEEK MEDICAL CARE IF: You have persistent spotting of blood in your stool 2-3 days after the  procedure. SEEK IMMEDIATE MEDICAL CARE IF:  You have more than a small spotting of blood in your stool.  You pass large blood clots in your stool.  Your abdomen is swollen (distended).  You have nausea or vomiting.  You have a fever.  You have increasing abdominal pain that is not relieved with medicine.   This information is not intended to replace advice given to you by your health care provider. Make sure you discuss any questions you have with your health care provider.   Document Released: 04/14/2004 Document Revised: 06/21/2013 Document Reviewed: 05/08/2013 Elsevier Interactive Patient Education 2016 [employer]  Colon Polyps Polyps are lumps of extra tissue growing inside the body. Polyps can grow in the large intestine (colon). Most colon polyps are noncancerous (benign). However, some colon polyps can become cancerous over time. Polyps that are larger than a pea may be harmful. To be safe, caregivers remove and test all polyps. CAUSES  Polyps form when mutations in the genes cause your cells to grow and divide even though no more tissue is needed. RISK FACTORS There are a number of risk factors that can increase your chances of getting colon polyps. They include:  Being older than 50 years.  Family history of colon polyps or colon cancer.  Long-term colon diseases, such as colitis or Crohn disease.  Being overweight.  Smoking.  Being inactive.  Drinking too much alcohol. SYMPTOMS  Most small polyps do not cause symptoms. If symptoms are present, they may include:  Blood in the stool. The stool may look dark red  or black.  Constipation or diarrhea that lasts longer than 1 week. DIAGNOSIS People often do not know they have polyps until their caregiver finds them during a regular checkup. Your caregiver can use 4 tests to check for polyps:  Digital rectal exam. The caregiver wears gloves and feels inside the rectum. This test would find polyps only in the  rectum.  Barium enema. The caregiver puts a liquid called barium into your rectum before taking X-rays of your colon. Barium makes your colon look white. Polyps are dark, so they are easy to see in the X-ray pictures.  Sigmoidoscopy. A thin, flexible tube (sigmoidoscope) is placed into your rectum. The sigmoidoscope has a light and tiny camera in it. The caregiver uses the sigmoidoscope to look at the last third of your colon.  Colonoscopy. This test is like sigmoidoscopy, but the caregiver looks at the entire colon. This is the most common method for finding and removing polyps. TREATMENT  Any polyps will be removed during a sigmoidoscopy or colonoscopy. The polyps are then tested for cancer. PREVENTION  To help lower your risk of getting more colon polyps:  Eat plenty of fruits and vegetables. Avoid eating fatty foods.  Do not smoke.  Avoid drinking alcohol.  Exercise every day.  Lose weight if recommended by your caregiver.  Eat plenty of calcium and folate. Foods that are rich in calcium include milk, cheese, and broccoli. Foods that are rich in folate include chickpeas, kidney beans, and spinach. HOME CARE INSTRUCTIONS Keep all follow-up appointments as directed by your caregiver. You may need periodic exams to check for polyps. SEEK MEDICAL CARE IF: You notice bleeding during a bowel movement.   This information is not intended to replace advice given to you by your health care provider. Make sure you discuss any questions you have with your health care provider.   Document Released: 05/27/2004 Document Revised: 09/21/2014 Document Reviewed: 11/10/2011 Elsevier Interactive Patient Education [employer].

## 2016-04-01 NOTE — Op Note (Signed)
[facility] Patient Name: Anthony Harrell Procedure Date: 04/01/2016 10:11 AM MRN: [id number] Date of Birth: 11-28-1928 Attending MD: Hildred Laser , MD CSN: [id number] Age: 82 Admit Type: Outpatient Procedure:                Colonoscopy Indications:              High risk colon cancer surveillance: Personal                            history of colonic polyps Providers:                Hildred Laser, MD, Lurline Del, RN, Randa Spike,                            Technician Referring MD:              Medicines:                Meperidine 50 mg IV, Midazolam 2 mg IV Complications:            No immediate complications. Estimated Blood Loss:     Estimated blood loss was minimal. Procedure:                Pre-Anesthesia Assessment:                           - Prior to the procedure, a History and Physical                            was performed, and patient medications and                            allergies were reviewed. The patient's tolerance of                            previous anesthesia was also reviewed. The risks                            and benefits of the procedure and the sedation                            options and risks were discussed with the patient.                            All questions were answered, and informed consent                            was obtained. Prior Anticoagulants: The patient                            last took aspirin 5 days and Plavix (clopidogrel) 5                            days prior to the procedure. ASA Grade Assessment:  III - A patient with severe systemic disease. After                            reviewing the risks and benefits, the patient was                            deemed in satisfactory condition to undergo the                            procedure.                           After obtaining informed consent, the colonoscope                            was passed under direct vision. Throughout  the                            procedure, the patient's blood pressure, pulse, and                            oxygen saturations were monitored continuously. The                            [id number]) was introduced through the                            anus and advanced to the the cecum, identified by                            appendiceal orifice and ileocecal valve. The                            colonoscopy was performed without difficulty. The                            patient tolerated the procedure well. The quality                            of the bowel preparation was adequate. The                            ileocecal valve, appendiceal orifice, and rectum                            were photographed. Scope In: 10:23:21 AM Scope Out: 10:52:57 AM Scope Withdrawal Time: 0 hours 21 minutes 25 seconds  Total Procedure Duration: 0 hours 29 minutes 36 seconds  Findings:      Three sessile polyps were found in the descending colon, ascending colon       and cecum. The polyps were small in size. These polyps were removed with       a cold snare. Resection and retrieval were complete.      Multiple small and large-mouthed diverticula were found in the sigmoid  colon, descending colon and hepatic flexure.      External hemorrhoids were found during retroflexion. The hemorrhoids       were small. Impression:               - Three small polyps in the descending colon, in                            the ascending colon and in the cecum, removed with                            a cold snare. Resected and retrieved.                           - Diverticulosis in the sigmoid colon, in the                            descending colon and at the hepatic flexure.                           - External hemorrhoids. Moderate Sedation:      Moderate (conscious) sedation was administered by the endoscopy nurse       and supervised by the endoscopist. The following parameters were        monitored: oxygen saturation, heart rate, blood pressure, CO2       capnography and response to care. Total physician intraservice time was       34 minutes. Recommendation:           - Patient has a contact number available for                            emergencies. The signs and symptoms of potential                            delayed complications were discussed with the                            patient. Return to normal activities tomorrow.                            Written discharge instructions were provided to the                            patient.                           - Resume previous diet today.                           - Continue present medications.                           - Resume aspirin tomorrow and Plavix (clopidogrel)                            tomorrow at prior doses.                           -  Await pathology results.                           - No repeat colonoscopy due to age. Procedure Code(s):        --- Professional ---                           [id number], Colonoscopy, flexible; with removal of                            tumor(s), polyp(s), or other lesion(s) by snare                            technique                           99152, Moderate sedation services provided by the                            same physician or other qualified health care                            professional performing the diagnostic or                            therapeutic service that the sedation supports,                            requiring the presence of an independent trained                            observer to assist in the monitoring of the                            patient's level of consciousness and physiological                            status; initial 15 minutes of intraservice time,                            patient age 14 years or older                           [phone], Moderate sedation services; each additional                            15 minutes  intraservice time Diagnosis Code(s):        --- Professional ---                           Z86.010, Personal history of colonic polyps                           D12.4, Benign neoplasm of descending colon  D12.2, Benign neoplasm of ascending colon                           D12.0, Benign neoplasm of cecum                           K64.4, Residual hemorrhoidal skin tags                           K57.30, Diverticulosis of large intestine without                            perforation or abscess without bleeding CPT copyright 2016 [employer]. All rights reserved. The codes documented in this report are preliminary and upon coder review may  be revised to meet current compliance requirements. Hildred Laser, MD Hildred Laser, MD 04/01/2016 11:02:16 AM This report has been signed electronically. Number of Addenda: 0

## 2016-04-03 ENCOUNTER — Encounter (HOSPITAL_COMMUNITY): Payer: Self-pay | Admitting: Internal Medicine

## 2016-04-24 IMAGING — US US ABDOMEN COMPLETE
1 series · 13 of 25 positions shown · non-contrast
Comparison: CT abdomen 09/07/2014

CLINICAL DATA: Nausea and weight loss

EXAM:
ULTRASOUND ABDOMEN COMPLETE

[Series 1: us abdomen complete · 0.21mm/px · 13 of 159 slices shown]
[im 1/159]
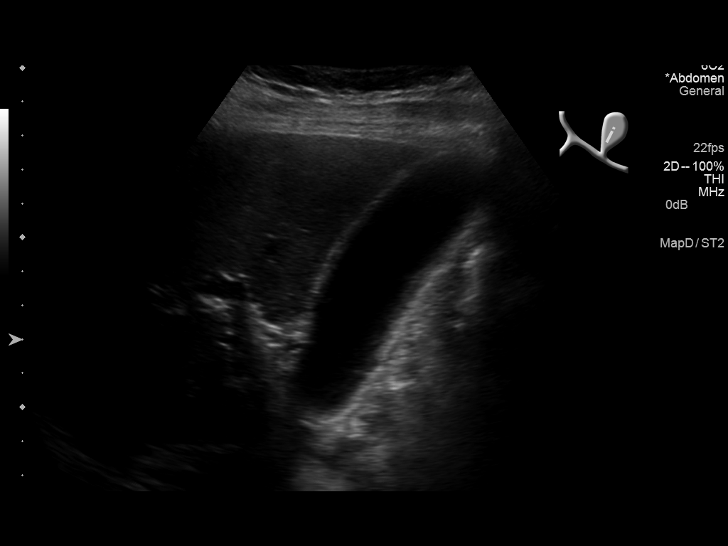
[im 14/159]
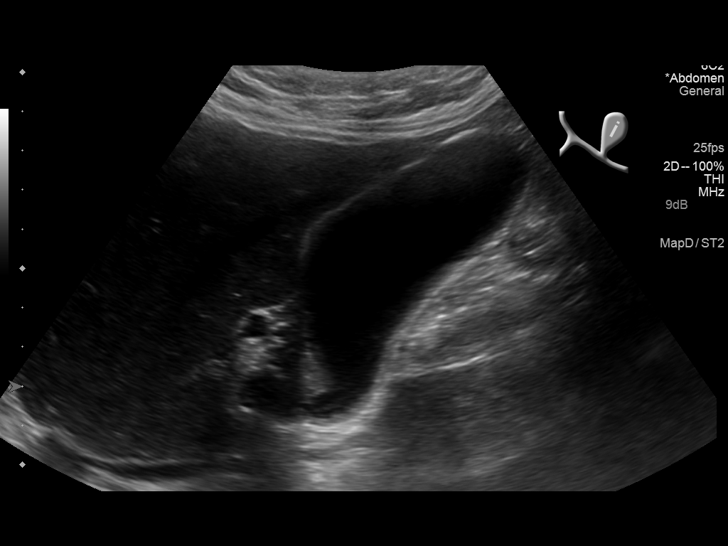
[im 27/159]
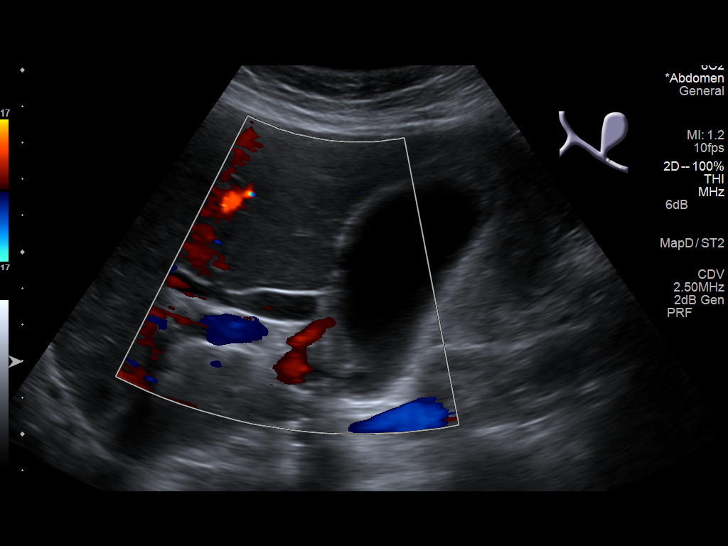
[im 40/159]
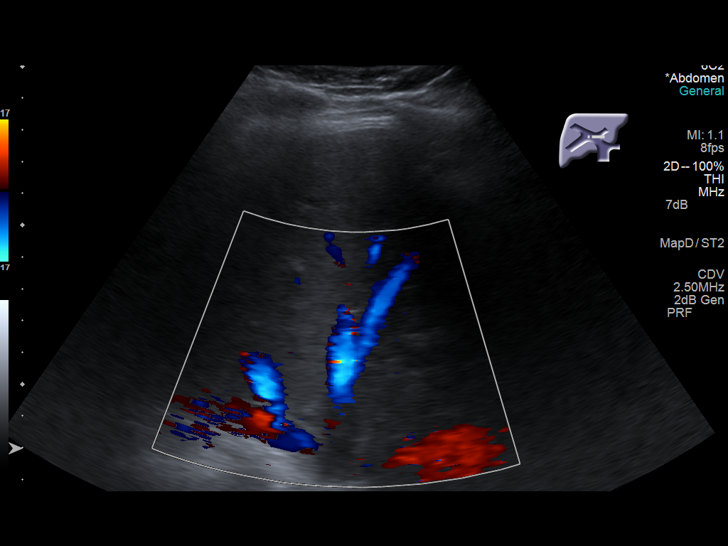
[im 53/159]
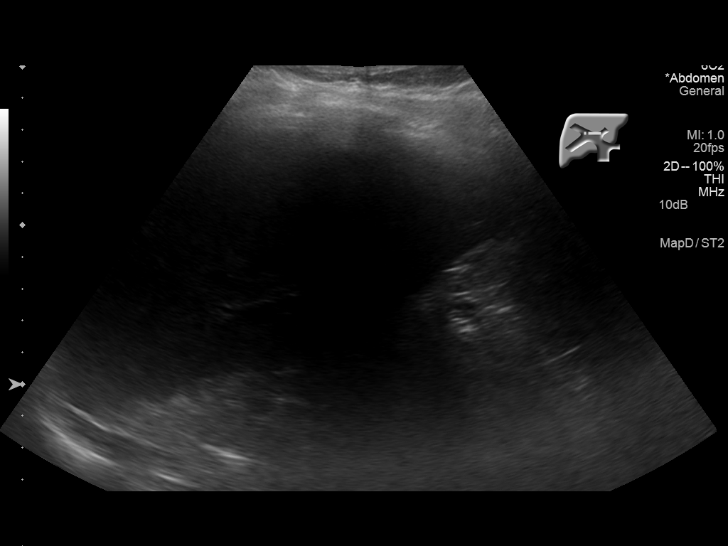
[im 66/159]
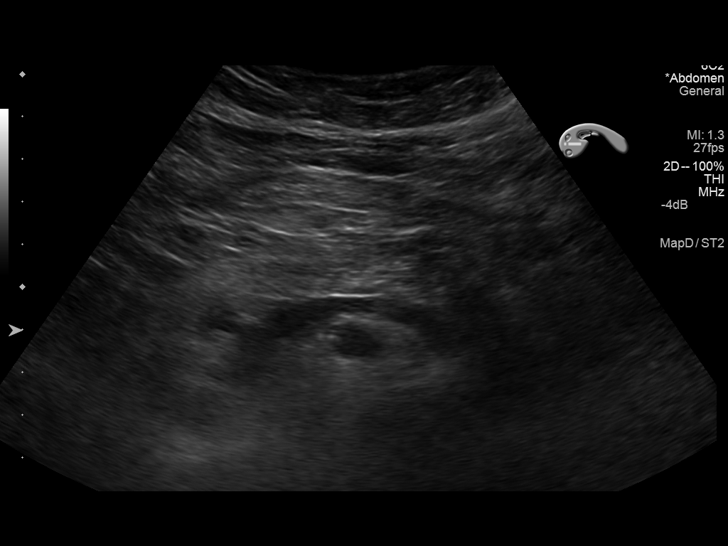
[im 80/159]
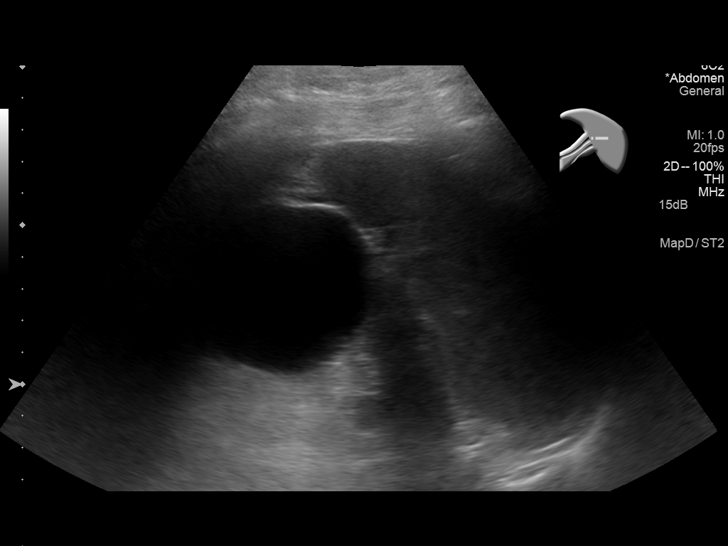
[im 93/159]
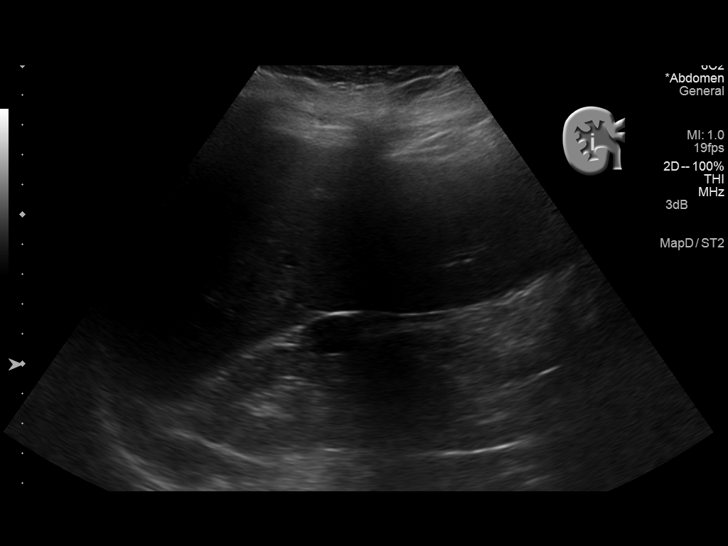
[im 106/159]
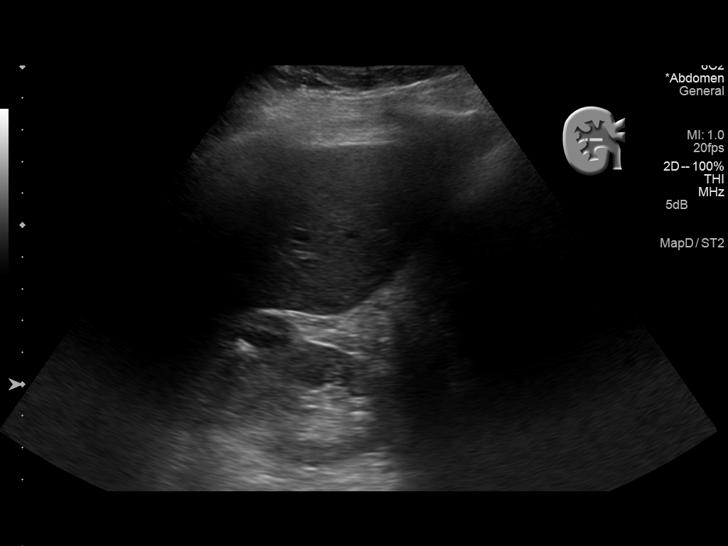
[im 119/159]
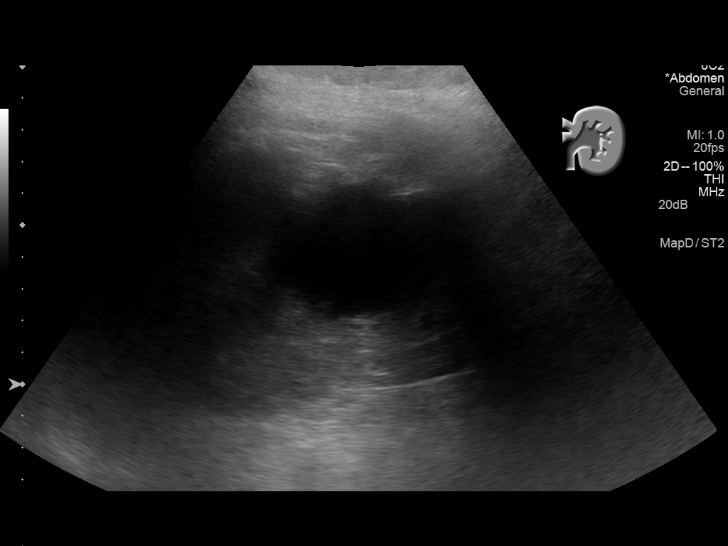
[im 132/159]
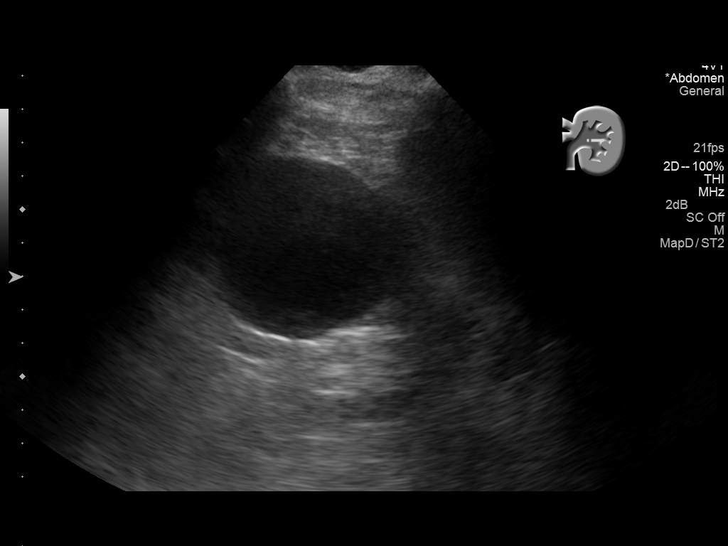
[im 145/159]
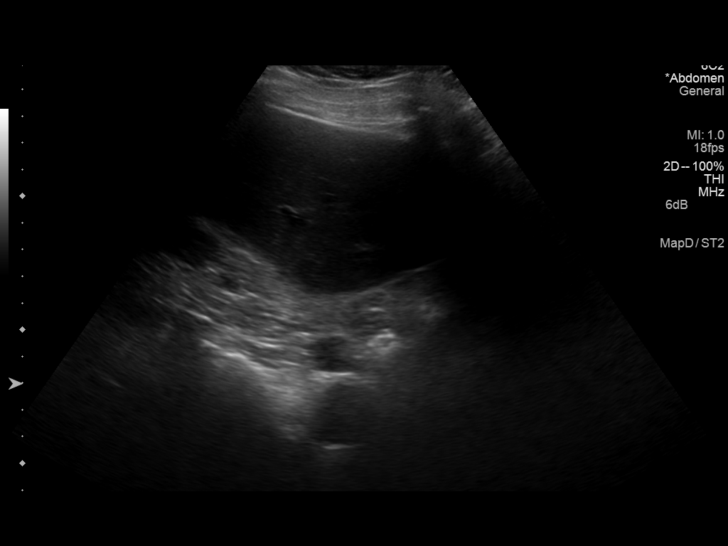
[im 159/159]
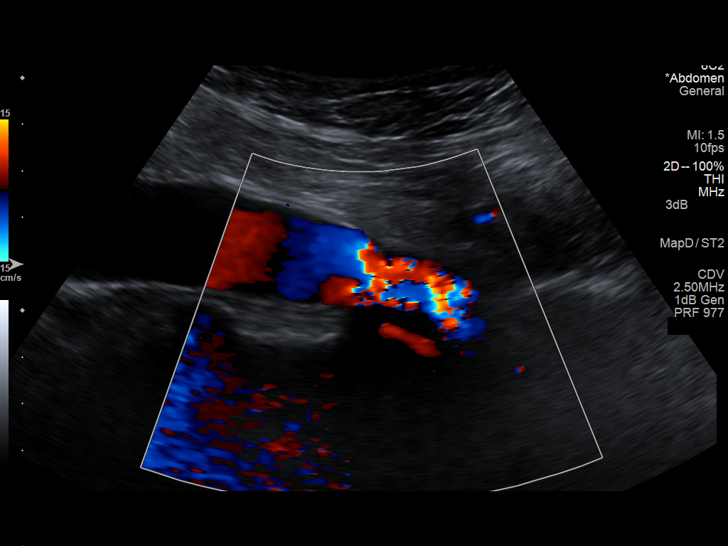

[13 of 25 positions shown; findings below may reference images not displayed]

FINDINGS: Gallbladder: No gallstones or wall thickening visualized. No
sonographic Murphy sign noted.

Common bile duct: Diameter: 6.7 mm, slightly enlarged but unchanged
from the prior CT. Correlate with liver function tests.

Liver: No focal lesion identified. Within normal limits in
parenchymal echogenicity.

IVC: No abnormality visualized.

Pancreas: Visualized portion unremarkable.

Spleen: Size and appearance within normal limits.

Right Kidney: Length: 9.9 cm.. Right lateral cyst 2.1 x 1.3 cm.
Adjacent echogenic focus could represent a stone however no calculus
seen on the prior CT. Negative for hydronephrosis.

Left Kidney: Length: 11.6 cm. Midpole cyst 5.9 cm. Left upper pole
cyst 1.9 cm.. Echogenicity within normal limits. No mass or
hydronephrosis visualized.

Abdominal aorta: Atherosclerotic aorta measuring up to 3.0 cm in
diameter.

Other findings: None.
IMPRESSION: Negative for gallstones. Common bile duct slightly dilated at
mm, correlate with liver function tests

Bilateral renal cyst.  No renal obstruction

Atherosclerotic aorta measuring 3.0 cm in maximal diameter.

## 2016-12-25 ENCOUNTER — Encounter (INDEPENDENT_AMBULATORY_CARE_PROVIDER_SITE_OTHER): Payer: Self-pay

## 2016-12-25 ENCOUNTER — Encounter (INDEPENDENT_AMBULATORY_CARE_PROVIDER_SITE_OTHER): Payer: Self-pay | Admitting: Internal Medicine

## 2017-01-28 ENCOUNTER — Telehealth (INDEPENDENT_AMBULATORY_CARE_PROVIDER_SITE_OTHER): Payer: Self-pay | Admitting: *Deleted

## 2017-01-28 NOTE — Telephone Encounter (Signed)
Patient called left a message stating he is having some stomach trouble, patient wants to see Dr Laural Golden only, patient is scheduled for a follow up in 05/2017. [phone]

## 2017-01-28 NOTE — Telephone Encounter (Signed)
Ms. Anthony Harrell , Patient wants only to see NUR. Stomach issues , similar to last episode.

## 2017-02-11 NOTE — Telephone Encounter (Signed)
Only seeing this now--We can [place] on 6/19 ar 1:30

## 2017-03-02 ENCOUNTER — Encounter (INDEPENDENT_AMBULATORY_CARE_PROVIDER_SITE_OTHER): Payer: Self-pay

## 2017-03-02 ENCOUNTER — Ambulatory Visit (INDEPENDENT_AMBULATORY_CARE_PROVIDER_SITE_OTHER): Payer: Medicare Other | Admitting: Internal Medicine

## 2017-03-02 ENCOUNTER — Encounter (INDEPENDENT_AMBULATORY_CARE_PROVIDER_SITE_OTHER): Payer: Self-pay | Admitting: Internal Medicine

## 2017-03-02 VITALS — BP 130/76 | HR 68 | Temp 97.8°F | Resp 18 | Ht 69.0 in | Wt 184.1 lb

## 2017-03-02 DIAGNOSIS — K219 Gastro-esophageal reflux disease without esophagitis: Secondary | ICD-10-CM | POA: Diagnosis not present

## 2017-03-02 DIAGNOSIS — R1013 Epigastric pain: Secondary | ICD-10-CM | POA: Diagnosis not present

## 2017-03-02 DIAGNOSIS — Z87898 Personal history of other specified conditions: Secondary | ICD-10-CM

## 2017-03-02 NOTE — Patient Instructions (Addendum)
Call if he have epigastric pain melena or rectal bleeding. Ask your [place] physician to refer you for physical therapy evaluation regarding leg weakness.

## 2017-03-02 NOTE — Progress Notes (Signed)
Presenting complaint;  Epigastric pain. History of GERD and weight loss.  Database and Subjective:  Anthony Harrell is 83 year old Caucasian male who is here for scheduled visit. He was last seen in May 2017. He has history of peptic ulcer disease secondary to NSAID therapy, GERD as well as constipation and weight loss. He states few weeks ago he developed epigastric pain. He was seen by his physician at the [facility] clinic and given medication which he used for couple weeks. He is now pain-free. He denies nausea vomiting melena or rectal bleeding. He feels heartburn is well controlled with therapy and he does not have dysphagia. His bowels move daily as long as he takes stool softener. He has gained 4 pounds since his last visit. His biggest complaint today is that he does not have strength and is size. He has noted difficulty standing up from squatting position. He is trying to do muscle strengthening exercises. He denies feeling numb in his lower extremities. He has not had any falling episodes.    Current Medications: Outpatient Encounter Prescriptions as of 03/02/2017  Medication Sig  . aspirin 81 MG tablet Take 1 tablet (81 mg total) by mouth daily.  Marland Kitchen atorvastatin (LIPITOR) 80 MG tablet Take 40 mg by mouth daily.   . Cholecalciferol (VITAMIN D-3) 1000 UNITS CAPS Take 1 capsule by mouth daily.  . clobetasol (TEMOVATE) 0.05 % external solution Apply 1 application topically daily.   . clopidogrel (PLAVIX) 75 MG tablet Take 1 tablet (75 mg total) by mouth daily with supper.  . co-enzyme Q-10 30 MG capsule Take 30 mg by mouth daily.   Marland Kitchen docusate sodium (COLACE) 100 MG capsule Take 2 capsules (200 mg total) by mouth daily. (Patient taking differently: Take 100 mg by mouth 2 (two) times daily. )  . ezetimibe (ZETIA) 10 MG tablet Take 10 mg by mouth daily.  Marland Kitchen FLUZONE QUADRIVALENT 0.5 ML injection Inject 0.5 mLs into the muscle once.   Marland Kitchen levothyroxine (SYNTHROID, LEVOTHROID) 150 MCG tablet Take 150 mcg by mouth  daily before breakfast.  . lisinopril (PRINIVIL,ZESTRIL) 10 MG tablet Take 10 mg by mouth daily.  Marland Kitchen LORazepam (ATIVAN) 1 MG tablet Take 1 mg by mouth daily.  . metoprolol succinate (TOPROL-XL) 50 MG 24 hr tablet Take 25 mg by mouth daily.   . nitroGLYCERIN (NITROSTAT) 0.4 MG SL tablet Place 0.4 mg under the tongue every 5 (five) minutes as needed.   . nystatin cream (MYCOSTATIN) Apply 1 application topically 2 (two) times daily.   Marland Kitchen omega-3 acid ethyl esters (LOVAZA) 1 G capsule Take 1 g by mouth 2 (two) times daily.   . ondansetron (ZOFRAN) 4 MG tablet Take 1 tablet (4 mg total) by mouth 2 (two) times daily as needed for nausea or vomiting.  . pantoprazole (PROTONIX) 40 MG tablet Take 1 tablet (40 mg total) by mouth daily before breakfast.  . tamsulosin (FLOMAX) 0.4 MG CAPS capsule Take 0.4 mg by mouth as needed.   . triamcinolone cream (KENALOG) 0.1 % Apply 1 application topically 2 (two) times daily.   . [DISCONTINUED] polyethylene glycol powder (GLYCOLAX/MIRALAX) powder Take 17 g by mouth once. (Patient not taking: Reported on 03/02/2017)  . [DISCONTINUED] Wheat Dextrin (BENEFIBER DRINK MIX) PACK Take 4 g by mouth at bedtime. (Patient not taking: Reported on 03/02/2017)   No facility-administered encounter medications on file as of 03/02/2017.      Objective: Blood pressure 130/76, pulse 68, temperature 97.8 F (36.6 C), temperature source Oral, resp. rate 18, height  5\' 9"  (1.753 m), weight 184 lb 1.6 oz (83.5 kg). Patient is alert and in no acute distress. He has mild hearing impairment. He has evidence of skin grafting over nose. Conjunctiva is pink. Sclera is nonicteric Oropharyngeal mucosa is normal. No neck masses or thyromegaly noted. Cardiac exam with regular rhythm normal S1 and S2. No murmur or gallop noted. Lungs are clear to auscultation. Abdomen is symmetrical soft and nontender without organomegaly or masses.  No LE edema or clubbing noted.   Assessment:  #1.  Epigastric pain. He is presently pain-free. He was treated with an unknown medication by his physician at the [facility] clinic. Will consider further evaluation of pain recurs. Patient is on Plavix. He is aware that he should not take OTC NSAIDs but can take Tylenol for musculoskeletal pain etc.  #2. GERD. He has chronic GERD. He's doing well with therapy.  #3. History of weight loss. His weight has leveled off. If anything he has gained 4 pounds since his last visit.   Plan:  Patient will call if epigastric pain recurs. Regarding lower extremity weakness patient will talk with his physician and [facility] about [facility]. Office visit in one year.

## 2017-04-06 ENCOUNTER — Emergency Department (HOSPITAL_COMMUNITY)
Admission: EM | Admit: 2017-04-06 | Discharge: 2017-04-06 | Discharge status: Against Medical Advice | Payer: Medicare Other | Attending: Emergency Medicine | Admitting: Emergency Medicine

## 2017-04-06 ENCOUNTER — Encounter (HOSPITAL_COMMUNITY): Payer: Self-pay

## 2017-04-06 DIAGNOSIS — L02212 Cutaneous abscess of back [any part, except buttock]: Secondary | ICD-10-CM | POA: Diagnosis present

## 2017-04-06 HISTORY — DX: Rectal abscess: K61.1

## 2017-04-06 NOTE — ED Notes (Signed)
Pt called without response, per previous conversation pt removed from system as LWBS after triage

## 2017-04-06 NOTE — ED Notes (Signed)
Pt states, "you have to cancel me off. I cannot stay. I have turned down several hospitals to come here, but I cannot stay. I just need some IV antibiotics and I can leave." pt reassured we wanted to provide safe, compassionate care & that we would get him in an available room as soon as possible, the pt remains adamant that he cannot stay to be seen and states, "I will start over tomorrow."

## 2017-04-06 NOTE — ED Notes (Signed)
Called 3x, no response.  

## 2017-04-06 NOTE — ED Triage Notes (Signed)
Per Pt, Pt was sent by MD to have IV antibiotics for an abscess that is located on his back. Pt has been being treated for the abscess with PO antibiotics with no relief.

## 2017-04-29 ENCOUNTER — Ambulatory Visit: Payer: Medicare Other | Admitting: General Surgery

## 2017-05-25 ENCOUNTER — Encounter (INDEPENDENT_AMBULATORY_CARE_PROVIDER_SITE_OTHER): Payer: Self-pay

## 2017-05-25 ENCOUNTER — Encounter (INDEPENDENT_AMBULATORY_CARE_PROVIDER_SITE_OTHER): Payer: Self-pay | Admitting: Internal Medicine

## 2017-05-25 ENCOUNTER — Ambulatory Visit (INDEPENDENT_AMBULATORY_CARE_PROVIDER_SITE_OTHER): Payer: Medicare Other | Admitting: Internal Medicine

## 2017-05-25 VITALS — BP 124/66 | HR 68 | Temp 97.4°F | Resp 18 | Ht 69.0 in | Wt 185.1 lb

## 2017-05-25 DIAGNOSIS — K219 Gastro-esophageal reflux disease without esophagitis: Secondary | ICD-10-CM

## 2017-05-25 DIAGNOSIS — M6281 Muscle weakness (generalized): Secondary | ICD-10-CM

## 2017-05-25 NOTE — Patient Instructions (Signed)
Physician will call with results of blood tests 

## 2017-05-25 NOTE — Progress Notes (Signed)
Presenting complaint;  Lower extremity weakness. History of GERD.  Subjective:  Anthony Harrell is 83 year old Caucasian male who is here for scheduled visit. He rarely has heartburn since he has been on pantoprazole. He denies nausea vomiting dysphagia abdominal pain melena or rectal bleeding. His bowels have been moving daily since he has been taking stool softener. His main complaint today is one of weakness in his quads. He has difficulty going up and down the stairs. He does not walk as much as he used to. He denies claudication. He also denies joint pain. He wants to know what he could do to improve this weakness.   Current Medications: Outpatient Encounter Prescriptions as of 05/25/2017  Medication Sig  . aspirin 81 MG tablet Take 1 tablet (81 mg total) by mouth daily.  Marland Kitchen atorvastatin (LIPITOR) 80 MG tablet Take 40 mg by mouth daily.   . Cholecalciferol (VITAMIN D-3) 1000 UNITS CAPS Take 1 capsule by mouth daily.  . clobetasol (TEMOVATE) 0.05 % external solution Apply 1 application topically daily.   . clopidogrel (PLAVIX) 75 MG tablet Take 1 tablet (75 mg total) by mouth daily with supper.  . co-enzyme Q-10 30 MG capsule Take 30 mg by mouth daily.   Marland Kitchen docusate sodium (COLACE) 100 MG capsule Take 2 capsules (200 mg total) by mouth daily. (Patient taking differently: Take 100 mg by mouth 2 (two) times daily. )  . ezetimibe (ZETIA) 10 MG tablet Take 10 mg by mouth daily.  Marland Kitchen levothyroxine (SYNTHROID, LEVOTHROID) 150 MCG tablet Take 150 mcg by mouth daily before breakfast.  . lisinopril (PRINIVIL,ZESTRIL) 10 MG tablet Take 10 mg by mouth daily.  Marland Kitchen LORazepam (ATIVAN) 1 MG tablet Take 1 mg by mouth daily.  . metoprolol succinate (TOPROL-XL) 50 MG 24 hr tablet Take 25 mg by mouth daily.   . nitroGLYCERIN (NITROSTAT) 0.4 MG SL tablet Place 0.4 mg under the tongue every 5 (five) minutes as needed.   . nystatin cream (MYCOSTATIN) Apply 1 application topically 2 (two) times daily.   Marland Kitchen omega-3 acid ethyl  esters (LOVAZA) 1 G capsule Take 1 g by mouth 2 (two) times daily.   . pantoprazole (PROTONIX) 40 MG tablet Take 1 tablet (40 mg total) by mouth daily before breakfast.  . tamsulosin (FLOMAX) 0.4 MG CAPS capsule Take 0.4 mg by mouth as needed.   . triamcinolone cream (KENALOG) 0.1 % Apply 1 application topically 2 (two) times daily.   . [DISCONTINUED] FLUZONE QUADRIVALENT 0.5 ML injection Inject 0.5 mLs into the muscle once.    No facility-administered encounter medications on file as of 05/25/2017.      Objective: Blood pressure 124/66, pulse 68, temperature (!) 97.4 F (36.3 C), temperature source Oral, resp. rate 18, height 5\' 9"  (1.753 m), weight 185 lb 1.6 oz (84 kg). Patient is alert and in no acute distress. Conjunctiva is pink. Sclera is nonicteric Oropharyngeal mucosa is normal. No neck masses or thyromegaly noted. Cardiac exam with regular rhythm normal S1 and S2. No murmur or gallop noted. Lungs are clear to auscultation. Abdomen is symmetrical soft and nontender without organomegaly or masses. No LE edema or clubbing noted. He may have atrophy to both quadriceps however is muscle strength and proximally and distally is normal.   Assessment:  #1. Muscle weakness involving both quads. He does have reduced muscle mass but strength is normal. Need to make sure he does not have myopathy secondary to statin. If this is not the case see may benefit from physical therapy but  will leave it up to his primary care physician.  #2. Chronic GERD. He is doing well with therapy. He also has history of peptic ulcer disease. Therefore will leave him on PPI for now.  Plan:  Patient will go to the lab for CPK and serum aldolase level. I will contact patient with results of blood test and presuming both of these Sasser normal he should follow with primary care physician for further evaluation. Office visit in 6 months.

## 2017-05-28 LAB — CK: Total CK: 112 U/L (ref 44–196)

## 2017-05-28 LAB — ALDOLASE: ALDOLASE: 3.8 U/L (ref <=8.1)

## 2017-09-25 ENCOUNTER — Encounter (HOSPITAL_COMMUNITY): Payer: Self-pay | Admitting: Emergency Medicine

## 2017-09-25 ENCOUNTER — Emergency Department (HOSPITAL_COMMUNITY): Payer: Medicare Other

## 2017-09-25 ENCOUNTER — Emergency Department (HOSPITAL_COMMUNITY)
Admission: EM | Admit: 2017-09-25 | Discharge: 2017-09-25 | Disposition: A | Discharge status: Home / Self Care | Payer: Medicare Other | Attending: Emergency Medicine | Admitting: Emergency Medicine

## 2017-09-25 ENCOUNTER — Other Ambulatory Visit: Payer: Self-pay

## 2017-09-25 DIAGNOSIS — R42 Dizziness and giddiness: Secondary | ICD-10-CM | POA: Diagnosis not present

## 2017-09-25 DIAGNOSIS — E7800 Pure hypercholesterolemia, unspecified: Secondary | ICD-10-CM | POA: Diagnosis not present

## 2017-09-25 DIAGNOSIS — I10 Essential (primary) hypertension: Secondary | ICD-10-CM | POA: Insufficient documentation

## 2017-09-25 DIAGNOSIS — Z87891 Personal history of nicotine dependence: Secondary | ICD-10-CM | POA: Diagnosis not present

## 2017-09-25 DIAGNOSIS — Z79899 Other long term (current) drug therapy: Secondary | ICD-10-CM | POA: Diagnosis not present

## 2017-09-25 DIAGNOSIS — Z7982 Long term (current) use of aspirin: Secondary | ICD-10-CM | POA: Diagnosis not present

## 2017-09-25 DIAGNOSIS — I252 Old myocardial infarction: Secondary | ICD-10-CM | POA: Insufficient documentation

## 2017-09-25 LAB — URINALYSIS, COMPLETE (UACMP) WITH MICROSCOPIC
Bacteria, UA: NONE SEEN
Bilirubin Urine: NEGATIVE
Glucose, UA: NEGATIVE mg/dL
HGB URINE DIPSTICK: NEGATIVE
Ketones, ur: NEGATIVE mg/dL
Leukocytes, UA: NEGATIVE
Nitrite: NEGATIVE
PH: 7 (ref 5.0–8.0)
PROTEIN: 100 mg/dL — AB
Specific Gravity, Urine: 1.006 (ref 1.005–1.030)
Squamous Epithelial / LPF: NONE SEEN
WBC, UA: NONE SEEN WBC/hpf (ref 0–5)

## 2017-09-25 LAB — CBC WITH DIFFERENTIAL/PLATELET
BASOS ABS: 0 10*3/uL (ref 0.0–0.1)
Basophils Relative: 0 %
EOS PCT: 4 %
Eosinophils Absolute: 0.3 10*3/uL (ref 0.0–0.7)
HCT: 39.1 % (ref 39.0–52.0)
Hemoglobin: 12.3 g/dL — ABNORMAL LOW (ref 13.0–17.0)
LYMPHS ABS: 1.5 10*3/uL (ref 0.7–4.0)
Lymphocytes Relative: 25 %
MCH: 28.1 pg (ref 26.0–34.0)
MCHC: 31.5 g/dL (ref 30.0–36.0)
MCV: 89.5 fL (ref 78.0–100.0)
MONO ABS: 0.3 10*3/uL (ref 0.1–1.0)
Monocytes Relative: 6 %
Neutro Abs: 3.8 10*3/uL (ref 1.7–7.7)
Neutrophils Relative %: 65 %
Platelets: 121 10*3/uL — ABNORMAL LOW (ref 150–400)
RBC: 4.37 MIL/uL (ref 4.22–5.81)
RDW: 13.7 % (ref 11.5–15.5)
WBC: 5.9 10*3/uL (ref 4.0–10.5)

## 2017-09-25 LAB — COMPREHENSIVE METABOLIC PANEL
ALT: 14 U/L — AB (ref 17–63)
AST: 22 U/L (ref 15–41)
Albumin: 3.5 g/dL (ref 3.5–5.0)
Alkaline Phosphatase: 53 U/L (ref 38–126)
Anion gap: 6 (ref 5–15)
BILIRUBIN TOTAL: 1 mg/dL (ref 0.3–1.2)
BUN: 18 mg/dL (ref 6–20)
CO2: 27 mmol/L (ref 22–32)
CREATININE: 1.02 mg/dL (ref 0.61–1.24)
Calcium: 9 mg/dL (ref 8.9–10.3)
Chloride: 106 mmol/L (ref 101–111)
GFR calc Af Amer: >60 mL/min (ref >60)
Glucose, Bld: 95 mg/dL (ref 65–99)
Potassium: 4.5 mmol/L (ref 3.5–5.1)
Sodium: 139 mmol/L (ref 135–145)
TOTAL PROTEIN: 6.2 g/dL — AB (ref 6.5–8.1)

## 2017-09-25 LAB — CBG MONITORING, ED: Glucose-Capillary: 83 mg/dL (ref 65–99)

## 2017-09-25 MED ORDER — SODIUM CHLORIDE 0.9 % IV SOLN
INTRAVENOUS | Status: DC
  Administered 2017-09-25: 125 mL/h via INTRAVENOUS

## 2017-09-25 MED ORDER — SODIUM CHLORIDE 0.9 % IV BOLUS (SEPSIS)
1000.0000 mL | Freq: Once | INTRAVENOUS | Status: AC
  Administered 2017-09-25: 1000 mL via INTRAVENOUS

## 2017-09-25 MED ORDER — MECLIZINE HCL 12.5 MG PO TABS
25.0000 mg | ORAL_TABLET | Freq: Once | ORAL | Status: AC
Start: 2017-09-25 — End: 2017-09-25
  Administered 2017-09-25: 25 mg via ORAL
  Filled 2017-09-25: qty 2

## 2017-09-25 MED ORDER — MECLIZINE HCL 12.5 MG PO TABS: 12.5000 mg | ORAL_TABLET | Freq: Three times a day (TID) | ORAL | 0 refills | Status: AC | PRN

## 2017-09-25 NOTE — ED Triage Notes (Signed)
Per EMS, cbg 85. Pt reports dizziness since waking up this am around 830. Pt reports went to bed last night at 1030pm. Pt alert and oriented. Speech clear. No drift noted. Pt able to move all extremities to command. Pt reports increased dizziness with position changes.

## 2017-09-25 NOTE — ED Provider Notes (Signed)
[facility] EMERGENCY DEPARTMENT Provider Note   CSN: [id number] Arrival date & time: 09/25/17  1340     History   Chief Complaint Chief Complaint  Patient presents with  . Dizziness    HPI Anthony Harrell is a 84 y.o. male.  HPI  Presents with concern of dizziness. Symptoms began after the patient awoke this morning and, and to try to get out of bed. He describes it as a lightheaded/disequilibrium sensation, without syncope, without pain, without weakness in his extremities. Symptoms are worse with activity, better at rest. He has had no prior similar episodes. No clear alleviating or exacerbating factors beyond rest and activity. He is here with his wife and a caregiver who assist with the HPI. No recent medication changes, diet changes, activity changes beyond addition of 1 antihypertensive.   Past Medical History:  Diagnosis Date  . Anxiety   . GERD (gastroesophageal reflux disease)   . High cholesterol   . Hypertension   . Hypothyroidism   . MI, old   . Perirectal abscess     Patient Active Problem List   Diagnosis Date Noted  . Weight loss 10/30/2014  . Nausea without vomiting 10/30/2014  . CAD (coronary artery disease) of artery bypass graft 12/07/2013  . High cholesterol 12/07/2013  . GERD (gastroesophageal reflux disease) 12/07/2013    Past Surgical History:  Procedure Laterality Date  . 3 open heart surgeries    . COLONOSCOPY    . COLONOSCOPY N/A 04/01/2016   Procedure: COLONOSCOPY;  Surgeon: Rogene Houston, MD;  Location: [facility] ENDO [facility];  Service: Endoscopy;  Laterality: N/A;  1030  . CORONARY ANGIOPLASTY WITH STENT PLACEMENT     10 yrs ago.  . CORONARY ARTERY BYPASS GRAFT    . ESOPHAGOGASTRODUODENOSCOPY N/A 12/27/2013   Procedure: ESOPHAGOGASTRODUODENOSCOPY (EGD);  Surgeon: Rogene Houston, MD;  Location: [facility] SUITE;  Service: Endoscopy;  Laterality: N/A;  145  . peri rectal abscess    . Stomach aneurysm surgery  (6cm)         Home  Medications    Prior to Admission medications   Medication Sig Start Date End Date Taking? Authorizing Provider  aspirin 81 MG tablet Take 1 tablet (81 mg total) by mouth daily. 04/02/16  Yes Rehman, Mechele Dawley, MD  atorvastatin (LIPITOR) 80 MG tablet Take 40 mg by mouth daily.    Yes [provider]  clobetasol (TEMOVATE) 0.05 % external solution Apply 1 application topically daily.  12/09/15  Yes [provider]  clopidogrel (PLAVIX) 75 MG tablet Take 1 tablet (75 mg total) by mouth daily with supper. 04/02/16  Yes Rehman, Mechele Dawley, MD  co-enzyme Q-10 30 MG capsule Take 30 mg by mouth daily.    Yes [provider]  docusate sodium (COLACE) 100 MG capsule Take 2 capsules (200 mg total) by mouth daily. Patient taking differently: Take 100 mg by mouth 2 (two) times daily.  09/03/14  Yes Rehman, Mechele Dawley, MD  ezetimibe (ZETIA) 10 MG tablet Take 10 mg by mouth daily.   Yes [provider]  levothyroxine (SYNTHROID, LEVOTHROID) 150 MCG tablet Take 150 mcg by mouth daily before breakfast.   Yes [provider]  lisinopril (PRINIVIL,ZESTRIL) 10 MG tablet Take 5 mg by mouth daily.  08/01/14  Yes [provider]  LORazepam (ATIVAN) 1 MG tablet Take 1 mg by mouth every evening.    Yes [provider]  metoprolol succinate (TOPROL-XL) 50 MG 24 hr tablet Take 25  mg by mouth daily.  05/26/14  Yes [provider]  nitroGLYCERIN (NITROSTAT) 0.4 MG SL tablet Place 0.4 mg under the tongue every 5 (five) minutes as needed.  12/14/15  Yes [provider]  nystatin cream (MYCOSTATIN) Apply 1 application topically 2 (two) times daily.  02/24/17  Yes [provider]  omega-3 acid ethyl esters (LOVAZA) 1 G capsule Take 1 g by mouth 2 (two) times daily.    Yes [provider]  pantoprazole (PROTONIX) 40 MG tablet Take 1 tablet (40 mg total) by mouth daily before breakfast. 10/10/14  Yes Rehman, Mechele Dawley, MD  tamsulosin (FLOMAX) 0.4  MG CAPS capsule Take 0.4 mg by mouth as needed (for urine flow).    Yes [provider]  triamcinolone cream (KENALOG) 0.1 % Apply 1 application topically 2 (two) times daily.  03/01/17  Yes [provider]    Family History History reviewed. No pertinent family history.  Social History Social History   Tobacco Use  . Smoking status: Former Smoker    Packs/day: 1.00    Years: 50.00    Pack years: 50.00    Types: Cigarettes  . Smokeless tobacco: Never Used  . Tobacco comment: Quit 20 yrs ago  Substance Use Topics  . Alcohol use: No    Alcohol/week: 0.0 oz  . Drug use: No     Allergies   Patient has no known allergies.   Review of Systems Review of Systems  Constitutional:       Per HPI, otherwise negative  HENT:       Per HPI, otherwise negative  Respiratory:       Per HPI, otherwise negative  Cardiovascular:       Per HPI, otherwise negative  Gastrointestinal: Negative for vomiting.  Endocrine:       Negative aside from HPI  Genitourinary:       Neg aside from HPI   Musculoskeletal:       Per HPI, otherwise negative  Skin: Negative.   Neurological: Positive for dizziness. Negative for syncope and headaches.     Physical Exam Updated Vital Signs BP (!) 178/66   Pulse (!) 58   Temp 97.7 F (36.5 C) (Oral)   Resp (!) 21   Ht 5\' 8"  (1.727 m)   Wt 81.6 kg (180 lb)   SpO2 99%   BMI 27.37 kg/m   Physical Exam  Constitutional: He is oriented to person, place, and time. He appears well-developed. No distress.  HENT:  Head: Normocephalic and atraumatic.  Eyes: Conjunctivae and EOM are normal.  Cardiovascular: Normal rate and regular rhythm.  Pulmonary/Chest: Effort normal. No stridor. No respiratory distress.  Abdominal: He exhibits no distension.  Musculoskeletal: He exhibits no edema.  Neurological: He is alert and oriented to person, place, and time. He displays no atrophy and no tremor. No cranial nerve deficit or sensory deficit. He  exhibits normal muscle tone. He displays no seizure activity. Coordination normal.  Patient cannot rotate his head, and to flex and extend his neck without any symptoms, but when sitting upright he feels some disequilibrium.  Skin: Skin is warm and dry.  Psychiatric: He has a normal mood and affect.  Nursing note and vitals reviewed.    ED Treatments / Results  Labs (all labs ordered are listed, but only abnormal results are displayed) Labs Reviewed  COMPREHENSIVE METABOLIC PANEL - Abnormal; Notable for the following components:      Result Value   Total Protein 6.2 (*)  ALT 14 (*)    All other components within normal limits  CBC WITH DIFFERENTIAL/PLATELET - Abnormal; Notable for the following components:   Hemoglobin 12.3 (*)    Platelets 121 (*)    All other components within normal limits  URINALYSIS, COMPLETE (UACMP) WITH MICROSCOPIC - Abnormal; Notable for the following components:   Color, Urine STRAW (*)    Protein, ur 100 (*)    All other components within normal limits  CBG MONITORING, ED    EKG  EKG Interpretation  Date/Time:  Saturday September 25 2017 14:04:51 EST Ventricular Rate:  60 PR Interval:    QRS Duration: 94 QT Interval:  427 QTC Calculation: 427 R Axis:   -57 Text Interpretation:  Sinus rhythm Prolonged PR interval Left anterior fascicular block ST-t wave abnormality Artifact Abnormal ekg Confirmed by Carmin Muskrat [phone]) on 09/25/2017 3:18:02 PM       Radiology Ct Head Wo Contrast  Result Date: 09/25/2017 CLINICAL DATA:  Dizziness since 8:30 a.m. today. EXAM: CT HEAD WITHOUT CONTRAST TECHNIQUE: Contiguous axial images were obtained from the base of the skull through the vertex without intravenous contrast. COMPARISON:  09/07/2014 and report dated 09/12/2016. FINDINGS: Brain: Diffusely enlarged ventricles and subarachnoid spaces. Patchy white matter low density in both cerebral hemispheres. Stable old right cerebellar hemisphere infarct. Stable  old small left cerebellar hemisphere infarct. No intracranial hemorrhage, mass lesion or CT evidence of acute infarction. Vascular: No hyperdense vessel or unexpected calcification. Skull: Normal. Negative for fracture or focal lesion. Sinuses/Orbits: Left ethmoid and right maxillary sinus mucosal thickening and small left maxillary sinus retention cyst, without significant change. Status post bilateral cataract extraction. Other: None. IMPRESSION: 1. No acute abnormality. 2. Atrophy, chronic small vessel white matter ischemic changes in both cerebral hemispheres and old bilateral cerebellar infarcts. 3. Chronic, longstanding left ethmoid and right maxillary sinusitis. Electronically Signed   By: Claudie Revering M.D.   On: 09/25/2017 15:04   Dg Chest Port 1 View  Result Date: 09/25/2017 CLINICAL DATA:  Dizziness since 8:30 a.m. today. EXAM: PORTABLE CHEST 1 VIEW COMPARISON:  09/07/2014 and report dated 09/12/2016. FINDINGS: The cardiac silhouette remains mildly enlarged with stable post CABG changes. Clear lungs with normal vascularity. Thoracic spine degenerative changes. IMPRESSION: No acute abnormality. Electronically Signed   By: Claudie Revering M.D.   On: 09/25/2017 15:05    Procedures Procedures (including critical care time)  Medications Ordered in ED Medications  sodium chloride 0.9 % bolus 1,000 mL (1,000 mLs Intravenous New Bag/Given 09/25/17 1524)    And  0.9 %  sodium chloride infusion (125 mL/hr Intravenous New Bag/Given 09/25/17 1524)  meclizine (ANTIVERT) tablet 25 mg (not administered)     Initial Impression / Assessment and Plan / ED Course  I have reviewed the triage vital signs and the nursing notes.  Pertinent labs & imaging results that were available during my care of the patient were reviewed by me and considered in my medical decision making (see chart for details).  4:20 PM Patient awake alert, now capable of sitting upright, moving his head freely, with no symptoms. We  discussed the likelihood of vertigo given the reassuring CT scan, labs, resolution of his dizziness. With no evidence for new neurologic dysfunction, and with substantial improvement in his condition, and the aforementioned reassuring findings, without evidence for new intracranial pathology, infection, electrolyte abnormalities, or substantial anemia, the patient is appropriate for discharge with anti-dizziness medication, close outpatient follow-up.  Final Clinical Impressions(s) / ED Diagnoses   Final  diagnoses:  Dizziness     Carmin Muskrat, MD 09/25/17 [phone]

## 2017-11-23 ENCOUNTER — Ambulatory Visit (INDEPENDENT_AMBULATORY_CARE_PROVIDER_SITE_OTHER): Payer: Medicare Other | Admitting: Internal Medicine

## 2017-11-23 ENCOUNTER — Encounter (INDEPENDENT_AMBULATORY_CARE_PROVIDER_SITE_OTHER): Payer: Self-pay | Admitting: Internal Medicine

## 2018-01-25 ENCOUNTER — Ambulatory Visit (INDEPENDENT_AMBULATORY_CARE_PROVIDER_SITE_OTHER): Payer: Medicare Other | Admitting: Internal Medicine

## 2018-01-25 ENCOUNTER — Encounter (INDEPENDENT_AMBULATORY_CARE_PROVIDER_SITE_OTHER): Payer: Self-pay | Admitting: *Deleted

## 2018-01-25 ENCOUNTER — Encounter (INDEPENDENT_AMBULATORY_CARE_PROVIDER_SITE_OTHER): Payer: Self-pay | Admitting: Internal Medicine

## 2018-01-25 VITALS — BP 130/70 | HR 66 | Temp 96.8°F | Ht 69.0 in | Wt 179.0 lb

## 2018-01-25 DIAGNOSIS — R110 Nausea: Secondary | ICD-10-CM | POA: Diagnosis not present

## 2018-01-25 DIAGNOSIS — K219 Gastro-esophageal reflux disease without esophagitis: Secondary | ICD-10-CM

## 2018-01-25 LAB — COMPREHENSIVE METABOLIC PANEL
AG Ratio: 1.9 (calc) (ref 1.0–2.5)
ALKALINE PHOSPHATASE (APISO): 57 U/L (ref 40–115)
ALT: 12 U/L (ref 9–46)
AST: 20 U/L (ref 10–35)
Albumin: 3.9 g/dL (ref 3.6–5.1)
BUN / CREAT RATIO: 19 (calc) (ref 6–22)
BUN: 21 mg/dL (ref 7–25)
CHLORIDE: 107 mmol/L (ref 98–110)
CO2: 26 mmol/L (ref 20–32)
CREATININE: 1.13 mg/dL — AB (ref 0.70–1.11)
Calcium: 9.3 mg/dL (ref 8.6–10.3)
GLOBULIN: 2.1 g/dL (ref 1.9–3.7)
GLUCOSE: 103 mg/dL (ref 65–139)
Potassium: 4.5 mmol/L (ref 3.5–5.3)
Sodium: 139 mmol/L (ref 135–146)
Total Bilirubin: 1.1 mg/dL (ref 0.2–1.2)
Total Protein: 6 g/dL — ABNORMAL LOW (ref 6.1–8.1)

## 2018-01-25 LAB — CBC
HCT: 36.9 % — ABNORMAL LOW (ref 38.5–50.0)
Hemoglobin: 12.6 g/dL — ABNORMAL LOW (ref 13.2–17.1)
MCH: 29 pg (ref 27.0–33.0)
MCHC: 34.1 g/dL (ref 32.0–36.0)
MCV: 85 fL (ref 80.0–100.0)
MPV: 9.4 fL (ref 7.5–12.5)
PLATELETS: 145 10*3/uL (ref 140–400)
RBC: 4.34 10*6/uL (ref 4.20–5.80)
RDW: 13.2 % (ref 11.0–15.0)
WBC: 6.9 10*3/uL (ref 3.8–10.8)

## 2018-01-25 MED ORDER — ONDANSETRON HCL 4 MG PO TABS: 4.0000 mg | ORAL_TABLET | Freq: Two times a day (BID) | ORAL | 1 refills | Status: AC | PRN

## 2018-01-25 NOTE — Patient Instructions (Signed)
Upper abdominal ultrasound to be scheduled. Physician will call with results of blood test and ultrasound when completed.

## 2018-01-25 NOTE — Progress Notes (Signed)
Presenting complaint;  Nausea and lower extremity weakness.  Subjective:  Patient is 84 year old Caucasian male who is here for scheduled visit.  He was last seen on 05/25/2017 for GERD and lower extremity muscle weakness.  I asked patient to follow-up with his primary care physician and consider physical therapy.  However he did not pursue these recommendations.  He continues to complain of weakness in lower extremities.  He states he is losing muscle mass.  He is not having any numbness.  He has not had any falling episodes.  His new complaint is 1 of intermittent nausea which started 4 to 6 months ago.  He says nausea may last for 2 to 3 days.  It is worse after meals.  It is never associated with vomiting or intractable.  He says his appetite is fair.  He has lost 6 pounds since his last visit.  He is not trying to lose weight.  He used to have problems with constipation but not anymore.  He states he is requiring less and less of medication.  He denies melena or rectal bleeding. He has been using Dramamine for nausea which helps some.  He denies epigastric pain.  He does not take OTC NSAIDs other than aspirin. Patient recalls that he had the procedure in [facility] about 20 years ago to remove stones from his bile duct.  He does not remember if he had gallstones or if cholecystectomy was recommended.   Current Medications: Outpatient Encounter Medications as of 01/25/2018  Medication Sig  . aspirin 81 MG tablet Take 1 tablet (81 mg total) by mouth daily.  Marland Kitchen atorvastatin (LIPITOR) 80 MG tablet Take 40 mg by mouth daily.   . clobetasol (TEMOVATE) 0.05 % external solution Apply 1 application topically daily.   . clopidogrel (PLAVIX) 75 MG tablet Take 1 tablet (75 mg total) by mouth daily with supper.  . co-enzyme Q-10 30 MG capsule Take 30 mg by mouth daily.   Marland Kitchen docusate sodium (COLACE) 100 MG capsule Take 2 capsules (200 mg total) by mouth daily. (Patient taking differently:  Take 100 mg by mouth 2 (two) times daily. )  . ezetimibe (ZETIA) 10 MG tablet Take 10 mg by mouth daily.  Marland Kitchen levothyroxine (SYNTHROID, LEVOTHROID) 150 MCG tablet Take 150 mcg by mouth daily before breakfast.  . lisinopril (PRINIVIL,ZESTRIL) 10 MG tablet Take 5 mg by mouth daily.   Marland Kitchen LORazepam (ATIVAN) 1 MG tablet Take 1 mg by mouth every evening.   . meclizine (ANTIVERT) 12.5 MG tablet Take 1 tablet (12.5 mg total) by mouth 3 (three) times daily as needed for dizziness.  . metoprolol succinate (TOPROL-XL) 50 MG 24 hr tablet Take 25 mg by mouth daily.   . nitroGLYCERIN (NITROSTAT) 0.4 MG SL tablet Place 0.4 mg under the tongue every 5 (five) minutes as needed.   . nystatin cream (MYCOSTATIN) Apply 1 application topically 2 (two) times daily.   Marland Kitchen omega-3 acid ethyl esters (LOVAZA) 1 G capsule Take 1 g by mouth 2 (two) times daily.   . pantoprazole (PROTONIX) 40 MG tablet Take 1 tablet (40 mg total) by mouth daily before breakfast.  . tamsulosin (FLOMAX) 0.4 MG CAPS capsule Take 0.4 mg by mouth as needed (for urine flow).   . triamcinolone cream (KENALOG) 0.1 % Apply 1 application topically 2 (two) times daily.    No facility-administered encounter medications on file as of 01/25/2018.      Objective: Blood pressure 130/70, pulse 66, temperature (!) 96.8 F (  36 C), temperature source Oral, height 5\' 9"  (1.753 m), weight 179 lb (81.2 kg). Patient is alert and in no acute distress. Conjunctiva is pink. Sclera is nonicteric Oropharyngeal mucosa is normal. No neck masses or thyromegaly noted. Cardiac exam with regular rhythm normal S1 and S2. No murmur or gallop noted. Lungs are clear to auscultation. Abdomen;  No LE edema or clubbing noted.  Labs/studies Results: Lab data from 09/25/2017 WBC 5.9, H&H 12.3 and 39.5 and platelet count 121K. Bilirubin 1.0, AP 53, AST 22, ALT 14, total protein 6.2 and albumin 3.5. Serum calcium was 9.0 BUN 18 and creatinine 1.02.   Assessment:  #1.  Nausea  of few months duration without vomiting and made worse with meals.  He has a history of peptic ulcer disease but EGD in April 2015 revealed healed gastric ulcer.  H. pylori testing was negative.  He has a history of choledocholithiasis.  It remains to be seen if symptoms are due to biliary tract disease or otherwise.  He is at risk for peptic ulcer disease given that he is on low-dose aspirin and clopidogrel.  He is somewhat protected because he is on a PPI.  #2.  GERD.  Typical symptoms are well controlled with therapy.  Will not change PPI while he is having other symptoms.  #3.  Abnormal CBC in January 2019 with mild thrombocytopenia.  Need to do follow-up testing.   Plan:  Ondansetron 4 mg p.o. twice daily PRN. Patient will go to the lab for CBC and comprehensive chemistry panel. We will schedule patient for upper abdominal ultrasound. Office visit as needed.

## 2018-02-01 ENCOUNTER — Ambulatory Visit (HOSPITAL_COMMUNITY): Admission: RE | Admit: 2018-02-01 | Payer: Medicare Other | Source: Ambulatory Visit

## 2018-05-24 ENCOUNTER — Ambulatory Visit (INDEPENDENT_AMBULATORY_CARE_PROVIDER_SITE_OTHER): Payer: Medicare Other | Admitting: Internal Medicine

## 2018-07-26 ENCOUNTER — Ambulatory Visit (INDEPENDENT_AMBULATORY_CARE_PROVIDER_SITE_OTHER): Payer: Medicare Other | Admitting: Internal Medicine

## 2018-07-28 ENCOUNTER — Ambulatory Visit (INDEPENDENT_AMBULATORY_CARE_PROVIDER_SITE_OTHER): Payer: Medicare Other | Admitting: Internal Medicine

## 2018-08-09 ENCOUNTER — Ambulatory Visit (INDEPENDENT_AMBULATORY_CARE_PROVIDER_SITE_OTHER): Payer: Medicare Other | Admitting: Internal Medicine

## 2020-01-09 ENCOUNTER — Encounter: Payer: Medicare Other | Admitting: Vascular Surgery

## 2020-05-14 ENCOUNTER — Emergency Department (HOSPITAL_COMMUNITY)
Admission: EM | Admit: 2020-05-14 | Discharge: 2020-05-14 | Disposition: A | Discharge status: Home / Self Care | Payer: Medicare Other | Attending: Emergency Medicine | Admitting: Emergency Medicine

## 2020-05-14 ENCOUNTER — Emergency Department (HOSPITAL_COMMUNITY): Payer: Medicare Other

## 2020-05-14 ENCOUNTER — Encounter (HOSPITAL_COMMUNITY): Payer: Self-pay

## 2020-05-14 ENCOUNTER — Other Ambulatory Visit: Payer: Self-pay

## 2020-05-14 DIAGNOSIS — Z955 Presence of coronary angioplasty implant and graft: Secondary | ICD-10-CM | POA: Insufficient documentation

## 2020-05-14 DIAGNOSIS — Z951 Presence of aortocoronary bypass graft: Secondary | ICD-10-CM | POA: Diagnosis not present

## 2020-05-14 DIAGNOSIS — Z87891 Personal history of nicotine dependence: Secondary | ICD-10-CM | POA: Insufficient documentation

## 2020-05-14 DIAGNOSIS — I2510 Atherosclerotic heart disease of native coronary artery without angina pectoris: Secondary | ICD-10-CM | POA: Insufficient documentation

## 2020-05-14 DIAGNOSIS — Z7982 Long term (current) use of aspirin: Secondary | ICD-10-CM | POA: Diagnosis not present

## 2020-05-14 DIAGNOSIS — L03115 Cellulitis of right lower limb: Secondary | ICD-10-CM

## 2020-05-14 DIAGNOSIS — I252 Old myocardial infarction: Secondary | ICD-10-CM | POA: Insufficient documentation

## 2020-05-14 DIAGNOSIS — I10 Essential (primary) hypertension: Secondary | ICD-10-CM | POA: Insufficient documentation

## 2020-05-14 DIAGNOSIS — Z7902 Long term (current) use of antithrombotics/antiplatelets: Secondary | ICD-10-CM | POA: Diagnosis not present

## 2020-05-14 DIAGNOSIS — Z79899 Other long term (current) drug therapy: Secondary | ICD-10-CM | POA: Insufficient documentation

## 2020-05-14 DIAGNOSIS — E039 Hypothyroidism, unspecified: Secondary | ICD-10-CM | POA: Diagnosis not present

## 2020-05-14 DIAGNOSIS — M79604 Pain in right leg: Secondary | ICD-10-CM | POA: Diagnosis present

## 2020-05-14 LAB — CBC WITH DIFFERENTIAL/PLATELET
Abs Immature Granulocytes: 0.02 10*3/uL (ref 0.00–0.07)
Basophils Absolute: 0.1 10*3/uL (ref 0.0–0.1)
Basophils Relative: 1 %
Eosinophils Absolute: 0.2 10*3/uL (ref 0.0–0.5)
Eosinophils Relative: 3 %
HCT: 38.6 % — ABNORMAL LOW (ref 39.0–52.0)
Hemoglobin: 12 g/dL — ABNORMAL LOW (ref 13.0–17.0)
Immature Granulocytes: 0 %
Lymphocytes Relative: 24 %
Lymphs Abs: 1.5 10*3/uL (ref 0.7–4.0)
MCH: 28.8 pg (ref 26.0–34.0)
MCHC: 31.1 g/dL (ref 30.0–36.0)
MCV: 92.8 fL (ref 80.0–100.0)
Monocytes Absolute: 0.5 10*3/uL (ref 0.1–1.0)
Monocytes Relative: 8 %
Neutro Abs: 4 10*3/uL (ref 1.7–7.7)
Neutrophils Relative %: 64 %
Platelets: 199 10*3/uL (ref 150–400)
RBC: 4.16 MIL/uL — ABNORMAL LOW (ref 4.22–5.81)
RDW: 13.6 % (ref 11.5–15.5)
WBC: 6.3 10*3/uL (ref 4.0–10.5)
nRBC: 0 % (ref 0.0–0.2)

## 2020-05-14 LAB — I-STAT CHEM 8, ED
BUN: 32 mg/dL — ABNORMAL HIGH (ref 8–23)
Calcium, Ion: 1.26 mmol/L (ref 1.15–1.40)
Chloride: 105 mmol/L (ref 98–111)
Creatinine, Ser: 1.6 mg/dL — ABNORMAL HIGH (ref 0.61–1.24)
Glucose, Bld: 93 mg/dL (ref 70–99)
HCT: 36 % — ABNORMAL LOW (ref 39.0–52.0)
Hemoglobin: 12.2 g/dL — ABNORMAL LOW (ref 13.0–17.0)
Potassium: 4 mmol/L (ref 3.5–5.1)
Sodium: 141 mmol/L (ref 135–145)
TCO2: 26 mmol/L (ref 22–32)

## 2020-05-14 MED ORDER — CLINDAMYCIN PHOSPHATE 600 MG/50ML IV SOLN
600.0000 mg | Freq: Once | INTRAVENOUS | Status: AC
  Administered 2020-05-14: 600 mg via INTRAVENOUS
  Filled 2020-05-14: qty 50

## 2020-05-14 MED ORDER — CLINDAMYCIN HCL 150 MG PO CAPS: 150.0000 mg | ORAL_CAPSULE | Freq: Four times a day (QID) | ORAL | 0 refills | Status: AC

## 2020-05-14 NOTE — Discharge Instructions (Signed)
Your leg swelling and redness is due to an infection.  Please soak your feet in warm water with Epson salt several times daily and pat it dry.  Take antibiotic as prescribed but be aware that antibiotic may increase risk of developing diarrhea.  Eat yogurt high in probiotic to decrease risk of diarrhea.  Call and follow-up with your doctor for further evaluation.  Please also contact vascular specialist for further examination of your legs as you are blood vessels will need further evaluation.  Return if you have any concern.

## 2020-05-14 NOTE — ED Provider Notes (Signed)
[facility] Provider Note   CSN: [id number] Arrival date & time: 05/14/20  1037     History Chief Complaint  Patient presents with  . Leg Pain    Anthony Harrell is a 86 y.o. male.  The history is provided by the patient and medical records. No language interpreter was used.  Leg Pain    86 year old male with history of hypertension, hyperlipidemia, CAD, presenting complaining of leg pain.  Patient is hard of hearing, history was difficult to obtain.  Patient states for the past year he has had recurrent pain to both of his legs right greater than left.  He also noticed swelling about his right leg more noticeable when he removes his socks.  For the past month he endorsed increasing pain in his legs.  Pain worse with ambulation and improves when he stretches his joints.  He denies any associated fever chills denies chest pain trouble breathing denies abdominal pain or back pain.  He denies any recent injury.  He is up-to-date with his Covid immunization.  Past Medical History:  Diagnosis Date  . Anxiety   . GERD (gastroesophageal reflux disease)   . High cholesterol   . Hypertension   . Hypothyroidism   . MI, old   . Perirectal abscess     Patient Active Problem List   Diagnosis Date Noted  . Weight loss 10/30/2014  . Nausea without vomiting 10/30/2014  . CAD (coronary artery disease) of artery bypass graft 12/07/2013  . High cholesterol 12/07/2013  . GERD (gastroesophageal reflux disease) 12/07/2013    Past Surgical History:  Procedure Laterality Date  . 3 open heart surgeries    . COLONOSCOPY    . COLONOSCOPY N/A 04/01/2016   Procedure: COLONOSCOPY;  Surgeon: Rogene Houston, MD;  Location: [facility];  Service: Endoscopy;  Laterality: N/A;  1030  . CORONARY ANGIOPLASTY WITH STENT PLACEMENT     10 yrs ago.  . CORONARY ARTERY BYPASS GRAFT    . ESOPHAGOGASTRODUODENOSCOPY N/A 12/27/2013   Procedure: ESOPHAGOGASTRODUODENOSCOPY  (EGD);  Surgeon: Rogene Houston, MD;  Location: [facility];  Service: Endoscopy;  Laterality: N/A;  145  . peri rectal abscess    . Stomach aneurysm surgery  (6cm)         No family history on file.  Social History   Tobacco Use  . Smoking status: Former Smoker    Packs/day: 1.00    Years: 50.00    Pack years: 50.00    Types: Cigarettes  . Smokeless tobacco: Never Used  . Tobacco comment: Quit 20 yrs ago  Substance Use Topics  . Alcohol use: No    Alcohol/week: 0.0 standard drinks  . Drug use: No    Home Medications Prior to Admission medications   Medication Sig Start Date End Date Taking? Authorizing Provider  aspirin 81 MG tablet Take 1 tablet (81 mg total) by mouth daily. 04/02/16   Rehman, Mechele Dawley, MD  atorvastatin (LIPITOR) 80 MG tablet Take 40 mg by mouth daily.     [provider]  clobetasol (TEMOVATE) 0.05 % external solution Apply 1 application topically daily.  12/09/15   [provider]  clopidogrel (PLAVIX) 75 MG tablet Take 1 tablet (75 mg total) by mouth daily with supper. 04/02/16   Rehman, Mechele Dawley, MD  co-enzyme Q-10 30 MG capsule Take 30 mg by mouth daily.     [provider]  docusate sodium (COLACE) 100 MG capsule Take  2 capsules (200 mg total) by mouth daily. Patient taking differently: Take 100 mg by mouth 2 (two) times daily.  09/03/14   Rogene Houston, MD  ezetimibe (ZETIA) 10 MG tablet Take 10 mg by mouth daily.    [provider]  levothyroxine (SYNTHROID, LEVOTHROID) 150 MCG tablet Take 150 mcg by mouth daily before breakfast.    [provider]  lisinopril (PRINIVIL,ZESTRIL) 10 MG tablet Take 5 mg by mouth daily.  08/01/14   [provider]  LORazepam (ATIVAN) 1 MG tablet Take 1 mg by mouth every evening.     [provider]  meclizine (ANTIVERT) 12.5 MG tablet Take 1 tablet (12.5 mg total) by mouth 3 (three) times daily as needed for dizziness. 09/25/17   Carmin Muskrat, MD    metoprolol succinate (TOPROL-XL) 50 MG 24 hr tablet Take 25 mg by mouth daily.  05/26/14   [provider]  nitroGLYCERIN (NITROSTAT) 0.4 MG SL tablet Place 0.4 mg under the tongue every 5 (five) minutes as needed.  12/14/15   [provider]  nystatin cream (MYCOSTATIN) Apply 1 application topically 2 (two) times daily.  02/24/17   [provider]  omega-3 acid ethyl esters (LOVAZA) 1 G capsule Take 1 g by mouth 2 (two) times daily.     [provider]  ondansetron (ZOFRAN) 4 MG tablet Take 1 tablet (4 mg total) by mouth 2 (two) times daily as needed for nausea or vomiting. 01/25/18   Rehman, Mechele Dawley, MD  pantoprazole (PROTONIX) 40 MG tablet Take 1 tablet (40 mg total) by mouth daily before breakfast. 10/10/14   Rehman, Mechele Dawley, MD  tamsulosin (FLOMAX) 0.4 MG CAPS capsule Take 0.4 mg by mouth as needed (for urine flow).     [provider]  triamcinolone cream (KENALOG) 0.1 % Apply 1 application topically 2 (two) times daily.  03/01/17   [provider]    Allergies    Patient has no known allergies.  Review of Systems   Review of Systems  All other systems reviewed and are negative.   Physical Exam Updated Vital Signs BP (!) 176/90   Pulse 82   Temp 97.7 F (36.5 C) (Oral)   Resp 15   Ht 5\' 9"  (1.753 m)   Wt 65.8 kg   SpO2 97%   BMI 21.41 kg/m   Physical Exam Vitals and nursing note reviewed.  Constitutional:      General: He is not in acute distress.    Appearance: He is well-developed.     Comments: Hard of hearing  HENT:     Head: Atraumatic.  Eyes:     Conjunctiva/sclera: Conjunctivae normal.  Cardiovascular:     Rate and Rhythm: Normal rate and regular rhythm.     Pulses: Normal pulses.     Heart sounds: Normal heart sounds.  Pulmonary:     Effort: Pulmonary effort is normal.     Breath sounds: Normal breath sounds. No wheezing, rhonchi or rales.  Abdominal:     Palpations: Abdomen is soft.  Musculoskeletal:         General: Swelling (Right lower extremity: 1+ pitting edema extending towards the mid shin.  Surrounding skin erythema to lower leg and dorsum of foot with purulent discharge to lateral foot.  DP pulse palpable.) present.     Cervical back: Neck supple.     Comments: Left lower extremity with normal appearance and intact distal pedal pulses.  Skin:    Findings: No  rash.  Neurological:     Mental Status: He is alert. Mental status is at baseline.  Psychiatric:        Mood and Affect: Mood normal.     ED Results / Procedures / Treatments   Labs (all labs ordered are listed, but only abnormal results are displayed) Labs Reviewed  CBC WITH DIFFERENTIAL/PLATELET - Abnormal; Notable for the following components:      Result Value   RBC 4.16 (*)    Hemoglobin 12.0 (*)    HCT 38.6 (*)    All other components within normal limits  I-STAT CHEM 8, ED - Abnormal; Notable for the following components:   BUN 32 (*)    Creatinine, Ser 1.60 (*)    Hemoglobin 12.2 (*)    HCT 36.0 (*)    All other components within normal limits    EKG None  Radiology DG Foot Complete Right  Result Date: 05/14/2020 CLINICAL DATA:  Right foot pain for 2 months. EXAM: RIGHT FOOT COMPLETE - 3+ VIEW COMPARISON:  March 27, 2020. FINDINGS: There is no evidence of fracture or dislocation. There is no evidence of arthropathy or other focal bone abnormality. Soft tissues are unremarkable. IMPRESSION: Negative. Electronically Signed   By: Marijo Conception M.D.   On: 05/14/2020 14:51    Procedures Procedures (including critical care time)  Medications Ordered in ED Medications  clindamycin (CLEOCIN) IVPB 600 mg (600 mg Intravenous New Bag/Given 05/14/20 1416)    ED Course  I have reviewed the triage vital signs and the nursing notes.  Pertinent labs & imaging results that were available during my care of the patient were reviewed by me and considered in my medical decision making (see chart for details).     MDM Rules/Calculators/A&P                          BP (!) 176/90   Pulse 82   Temp 97.7 F (36.5 C) (Oral)   Resp 15   Ht 5\' 9"  (1.753 m)   Wt 65.8 kg   SpO2 97%   BMI 21.41 kg/m   Final Clinical Impression(s) / ED Diagnoses Final diagnoses:  Cellulitis of right lower leg    Rx / DC Orders ED Discharge Orders         Ordered    clindamycin (CLEOCIN) 150 MG capsule  Every 6 hours        05/14/20 1502         1:26 PM Patient here with complaints of leg pain right greater than left which appears to be an ongoing issue for nearly a year but worse within the past month.  Examination concerning for edema as well as erythema involving his right lower extremity as compared to left.  I was able to express a small amount of purulent material from the lateral aspect of his distal foot.  I am concerned that this is likely cellulitis with underlying abscess.  He does have intact dorsalis pedis pulses however with history of CAD I suspect elements of claudication causing his recurrent pain.    1:56 PM Patient has dopplerable pulses.  I discussed care with Dr. Ronnald Nian, who recommend starting clindamycin antibiotic for his leg infection.  We have low suspicion for DVT.  Patient made aware that clindamycin may increase risk of C. difficile.  Encourage patient to consume probiotic to decrease risk of C. difficile.  Patient would benefit from outpatient follow-up with wound  care center, with vascular surgery as well as with PCP.   Domenic Moras, PA-C 05/19/20 [place], [place], DO 05/19/20 2325

## 2020-05-14 NOTE — ED Triage Notes (Signed)
Patient complains of 2 months of right lower leg and foot with redness and pain. Patient has seen his MD for same and taken pills with no relief

## 2020-05-14 NOTE — ED Provider Notes (Signed)
Medical screening examination/treatment/procedure(s) were conducted as a shared visit with non-physician practitioner(s) and myself.  I personally evaluated the patient during the encounter. Briefly, the patient is a 86 y.o. male who presents to the ED with right leg/foot pain.  Patient with unremarkable vitals.  History of high cholesterol.  Has cellulitic changes to the right foot which appears to be from an ulcer at the bottom of the foot at the base of the right fifth digit.  Patient has Doppler pulses bilaterally.  Suspect some PVD.  Does not have diabetes.  No concern for blood clot.  No concern for acute arterial occlusion.  No fever.  No major concern for sepsis.  Will get basic labs and x-ray.  However anticipate discharge with oral antibiotics and follow-up with vascular and wound care and primary care doctor.  Understands return precautions.  This chart was dictated using voice recognition software.  Despite best efforts to proofread,  errors can occur which can change the documentation meaning.    EKG Interpretation None            Lennice Sites, DO 05/14/20 1439

## 2020-05-15 DEATH — deceased

## 2020-06-24 ENCOUNTER — Other Ambulatory Visit: Payer: Self-pay | Admitting: *Deleted

## 2020-06-24 DIAGNOSIS — M79671 Pain in right foot: Secondary | ICD-10-CM

## 2021-02-12 DEATH — deceased
